# Patient Record
Sex: Female | Born: 1975 | Race: White | Hispanic: No | Marital: Married
Health system: Midwestern US, Community
[De-identification: ages and names within clinical notes are randomized; demographics above are authoritative.]

## PROBLEM LIST (undated history)

## (undated) DIAGNOSIS — Z1389 Encounter for screening for other disorder: Principal | ICD-10-CM

## (undated) DIAGNOSIS — K811 Chronic cholecystitis: Principal | ICD-10-CM

## (undated) DIAGNOSIS — Z9189 Other specified personal risk factors, not elsewhere classified: Principal | ICD-10-CM

## (undated) DIAGNOSIS — E611 Iron deficiency: Principal | ICD-10-CM

## (undated) DIAGNOSIS — K828 Other specified diseases of gallbladder: Principal | ICD-10-CM

## (undated) DIAGNOSIS — N939 Abnormal uterine and vaginal bleeding, unspecified: Secondary | ICD-10-CM

---

## 2023-01-15 ENCOUNTER — Encounter

## 2023-01-15 ENCOUNTER — Ambulatory Visit
Admit: 2023-01-15 | Discharge: 2023-01-15 | Payer: BLUE CROSS/BLUE SHIELD | Attending: Obstetrics & Gynecology | Primary: Family Medicine

## 2023-01-15 DIAGNOSIS — Z01419 Encounter for gynecological examination (general) (routine) without abnormal findings: Secondary | ICD-10-CM

## 2023-01-15 NOTE — Progress Notes (Signed)
Annual Exam Premenopausal    Sabrina Valenzuela is a 47 y.o. female.  Patient's last menstrual period was 12/20/2022 (exact date).   New Patient (Fertility issues/IVF twice )    The patient is a pleasant 47 year old  female with a last menstrual period of 12/20/22 who presents to the office for gyn exam and to discuss irregular menses.    Her menses are irregular but normal (from 13-40 days). She describes her period as lasting 3 days. She describes the flow as normal. She denies spotting between her periods. She has been having post coital vaginal bleeding (brown) for the last couple of months. She doesn't complain of cramps. She has severe pain with defecation on her menstrual cycle.     She is sexually active. The last time she had intercourse was 2 days ago. She reports dyspareunia, postcoital bleeding. She denies decreased libido. She is not concerned about STD's.     She is having additional significant GYN problems. Difficulty getting pregnant. She was followed in [place] by Dr. 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 He suggested she stop attempting pregnancy. She inquired about other methods. She is going to [place] to see her Gyn doctor there and will call to let me know if she wants a local referral.     Her last Pap smear was last year. The result was negative.    She does not have a primary care physician.       PHQ-9 Total Score: 0 (01/15/2023 11:32 AM)      GYN History  Menarche age: 43  Cycle Length35+ days Lasting 3 days.  Dysmenorrhea no  Patient's last menstrual period was 12/20/2022 (exact date).  Contraception:  no method  STD history: none  Abnormal Pap: Yes  Last Pap result: *NML  No results found for: "DIAG", "SPECADEQ", "METHOD", "[id number]", "HPV16"    Sexual history:   reports being sexually active and has had partner(s) who are female.     Medical conditions:    Since her last annual GYN exam about one year ago, she has not the  following changes in her health history: none.   Surgical history confirmed with patient.  has a past surgical history that includes myomectomy (10/14/2018); US BREAST BIOPSY W LOC DEVICE 1ST LESION RIGHT; and Breast biopsy (Right).    Past Medical History:   Diagnosis Date    History of high cholesterol      Past Surgical History:   Procedure Laterality Date    BREAST BIOPSY Right     MYOMECTOMY  10/14/2018    US BREAST BIOPSY W LOC DEVICE 1ST LESION RIGHT         No current outpatient medications on file.     No current facility-administered medications for this visit.       Allergies: Patient has no known allergies.     Tobacco History:  reports that she has never smoked. She has never used smokeless tobacco.   Alcohol Abuse:  reports no history of alcohol use.   Drug Abuse:  reports no history of drug use.     Family Medical/Cancer History: family history includes Breast Cancer in her mother and sister; Diabetes in her brother and mother; Hypertension in her brother and mother.       Review of Systems - History obtained from the patient  General/Constitutional:   Fatigue denies. Fever denies. Sleep disturbance denies.   Endocrine:  Cold intolerance denies. Heat intolerance denies.   Respiratory:   Shortness of breath denies.   Breast:   Breast pain denies. Nipple discharge denies.   Cardiovascular:   Chest Pain denies.   Gastrointestinal:   Abdominal pain denies. Constipation denies. Decreased appetite denies. Diarrhea denies.   Musculoskeletal:   Joint stiffness denies.   Skin:   Itching denies. Rash denies.   Genitourinary:   Difficulty urinating denies. Painful urination denies.   Neurologic:   Headache denies.   Psychiatric:   Anxiety denies. Depressed mood denies.          Physical Exam  BP 104/68   Ht 1.44 m (4' 8.69")   Wt 68.5 kg (151 lb)   LMP 12/20/2022 (Exact Date)   BMI 33.03 kg/m      General Examination:  GENERAL APPEARANCE: pleasant, well nourished, well developed, in no acute distress,  female.   HEAD: normocephalic, atraumatic.   NECK: no cervical lymphadenopathy, no thyromegaly.   HEART: normal rate and regular rhythm  LUNGS: clear to auscultation bilaterally.   BREASTS: normal, no masses palpable bilaterally, no discharge, no dimpling no axillary lymphadenopathy.   ABDOMEN:  soft, nontender, nondistended, no hepatosplenomegaly, no masses palpable.   SKIN: no suspicious lesions, warm and dry.   PSYCH: alert, oriented.   Gynecological:  EXTERNAL GENITALIA: introitus normal, no lesions.   URETHRA: normal.   VAGINA: normal, healthy pink mucosa without any lesions, normal discharge.   CERVIX: normal appearance, no cervical movement tenderness, no lesions or discharge or bleeding.   UTERUS: normal mobility, nontender, normal size.   ADNEXA: no mass, nontender.       Assessment and Plan:    1. Encounter for gynecological examination  2. Encounter to establish care  3. Cervical cancer screening  -     PAP IG, Aptima HPV and rfx 16/18,45 ([id number])  ([facility] DEFAULT); Future  4. Screening mammogram, encounter for  -     MAM TOMO DIGITAL SCREEN BILATERAL  5. Abnormal uterine bleeding (AUB)  -     Anti Mullerian Hormone; Future       gynecologic examination to establish care    Counseled re: diet, exercise, healthy lifestyle  Return for yearly wellness visits  Pt counseled regarding co-testing for high risk HPV with pap  Recommend screening mammo at 40     Return in about 1 year (around 01/15/2024) for Annual.  Donalee Citrin, MD

## 2023-01-15 NOTE — Other (Signed)
Normal pap

## 2023-01-18 LAB — VAGINITIS-BACTERIA/YEAST-CANDIDA
Candida Glabrata, NAA: NEGATIVE
Candida albicans, NAA: NEGATIVE

## 2023-01-19 LAB — PAP IG, APTIMA HPV AND RFX 16/18,45 (199305)
.: 0
HPV Aptima: NEGATIVE

## 2023-01-21 LAB — ANTI MULLERIAN HORMONE: Anti-Mullerian Hormone: <0.015 ng/mL

## 2023-02-05 ENCOUNTER — Inpatient Hospital Stay
Admit: 2023-02-05 | Payer: BLUE CROSS/BLUE SHIELD | Attending: Obstetrics & Gynecology | Primary: Obstetrics & Gynecology

## 2023-02-05 DIAGNOSIS — Z1231 Encounter for screening mammogram for malignant neoplasm of breast: Secondary | ICD-10-CM

## 2023-05-20 LAB — COLOGUARD TEST, EXTERNAL: Cologuard Test, External: NEGATIVE

## 2023-05-20 LAB — FECAL DNA COLORECTAL CANCER SCREENING (COLOGUARD): FIT-DNA (Cologuard): NEGATIVE

## 2023-07-17 ENCOUNTER — Encounter: Admit: 2023-07-17 | Admitting: Obstetrics & Gynecology

## 2023-07-17 DIAGNOSIS — Z124 Encounter for screening for malignant neoplasm of cervix: Secondary | ICD-10-CM

## 2023-07-30 ENCOUNTER — Encounter: Admit: 2023-07-30 | Admitting: Family Medicine

## 2023-07-30 ENCOUNTER — Ambulatory Visit
Admit: 2023-07-30 | Discharge: 2023-07-30 | Payer: BLUE CROSS/BLUE SHIELD | Attending: Family Medicine | Admitting: Family Medicine | Primary: Family Medicine

## 2023-07-30 VITALS — BP 118/74 | HR 74 | Resp 18 | Ht <= 58 in | Wt 146.0 lb

## 2023-07-30 DIAGNOSIS — I10 Essential (primary) hypertension: Secondary | ICD-10-CM

## 2023-07-30 DIAGNOSIS — Z1389 Encounter for screening for other disorder: Secondary | ICD-10-CM

## 2023-07-30 LAB — LIPID PANEL
Chol/HDL Ratio: 4.3 (ref 0.0–4.4)
Cholesterol, Total: 227 mg/dL — ABNORMAL HIGH (ref 100–200)
HDL: 53 mg/dL (ref >=50)
LDL Cholesterol: 145.8 mg/dL — ABNORMAL HIGH (ref 0.0–100.0)
LDL/HDL Ratio: 2.8
Triglycerides: 141 mg/dL (ref 0–149)
VLDL: 28.2 mg/dL (ref 5.0–40.0)

## 2023-07-30 LAB — COMPREHENSIVE METABOLIC PANEL
ALT: 15 U/L (ref 0–42)
AST: 19 U/L (ref 0–46)
Albumin/Globulin Ratio: 2.2 (ref 1.00–2.70)
Albumin: 4.8 g/dL (ref 3.5–5.2)
Alk Phosphatase: 60 U/L (ref 35–117)
Anion Gap: 15 mmol/L (ref 2–17)
BUN: 8 mg/dL (ref 6–20)
CO2: 22 mmol/L (ref 22–29)
Calcium: 9.3 mg/dL (ref 8.5–10.7)
Chloride: 104 mmol/L (ref 98–107)
Creatinine: 0.7 mg/dL (ref 0.5–1.0)
Est, Glom Filt Rate: 107 mL/min/1.73mÂ² (ref >=60)
Globulin: 2.2 g/dL (ref 1.9–4.4)
Glucose: 93 mg/dL (ref 70–99)
Osmolaliy Calculated: 279 mosm/kg (ref 270–287)
Potassium: 4.1 mmol/L (ref 3.5–5.3)
Sodium: 141 mmol/L (ref 135–145)
Total Bilirubin: 0.34 mg/dL (ref 0.00–1.20)
Total Protein: 7 g/dL (ref 5.7–8.3)

## 2023-07-30 LAB — CBC WITH AUTO DIFFERENTIAL
Basophils %: 0.6 % (ref 0.0–2.0)
Basophils Absolute: 0 10*3/uL (ref 0.0–0.2)
Eosinophils %: 1.7 % (ref 0.0–7.0)
Eosinophils Absolute: 0.1 10*3/uL (ref 0.0–0.5)
Hematocrit: 42 % (ref 34.0–47.0)
Hemoglobin: 13.9 g/dL (ref 11.5–15.7)
Immature Grans (Abs): 0.01 10*3/uL (ref 0.00–0.06)
Immature Granulocytes %: 0.2 % (ref 0.0–0.6)
Lymphocytes Absolute: 1.2 10*3/uL (ref 1.0–3.2)
Lymphocytes: 22.4 % (ref 15.0–45.0)
MCH: 29.3 pg (ref 27.0–34.5)
MCHC: 33.1 g/dL (ref 30.0–36.0)
MCV: 88.4 fL (ref 81.0–99.0)
MPV: 9.9 fL (ref 7.0–12.2)
Monocytes %: 8.7 % (ref 4.0–12.0)
Monocytes Absolute: 0.5 10*3/uL (ref 0.3–1.0)
NRBC Absolute: 0 10*3/uL (ref 0.000–0.012)
NRBC Automated: 0 % (ref 0.0–0.2)
Neutrophils %: 66.4 % (ref 42.0–74.0)
Neutrophils Absolute: 3.4 10*3/uL (ref 1.6–7.3)
Platelets: 260 10*3/uL (ref 140–440)
RBC: 4.75 x10e6/mcL (ref 3.60–5.20)
RDW: 11.9 % (ref 10.0–17.0)
WBC: 5.2 10*3/uL (ref 3.8–10.6)

## 2023-07-30 LAB — TSH WITH REFLEX: TSH: 2.59 u[IU]/mL (ref 0.358–3.740)

## 2023-07-30 LAB — IRON AND TIBC
Iron % Saturation: 30 % (ref 20–40)
Iron: 89 ug/dL (ref 37–145)
TIBC: 298 ug/dL (ref 250–450)
UIBC: 209 ug/dL (ref 112.0–347.0)

## 2023-07-30 LAB — VITAMIN D 25 HYDROXY: Vit D, 25-Hydroxy: 28.2 ng/mL — ABNORMAL LOW (ref 30.0–90.0)

## 2023-07-30 LAB — URINALYSIS W/ RFLX MICROSCOPIC
Bilirubin, Urine: NEGATIVE
Blood, Urine: NEGATIVE
Glucose, Ur: NEGATIVE
Ketones, Urine: NEGATIVE
Leukocyte Esterase, Urine: NEGATIVE
Nitrite, Urine: NEGATIVE
Protein, UA: NEGATIVE
Specific Gravity, UA: 1.02 (ref 1.003–1.035)
Urobilinogen, Urine: 0.2 EU/dL (ref >1.0)
pH, Urine: 6 (ref 4.5–8.0)

## 2023-07-30 LAB — FERRITIN: Ferritin: 40.4 ng/mL (ref 13.0–150.0)

## 2023-07-30 LAB — HEMOGLOBIN A1C
Estimated Avg Glucose: 123
Estimated Avg Glucose: 133
Hemoglobin A1C: 5.9 % (ref 4.0–6.0)

## 2023-07-30 NOTE — Progress Notes (Signed)
 CHIEF COMPLAINT:  Chief Complaint   Patient presents with    New Patient     Bp concerns   Last labs 4 months ago- will be scanned in chart   Pt feels that she is insulin resistant- wants a work up on that   Stong family history for camcer- wants a work up

## 2023-08-01 ENCOUNTER — Ambulatory Visit
Admit: 2023-08-01 | Discharge: 2023-08-01 | Payer: BLUE CROSS/BLUE SHIELD | Attending: Physician Assistant | Admitting: Physician Assistant | Primary: Family Medicine

## 2023-08-01 VITALS — BP 111/71 | HR 88 | Ht <= 58 in | Wt 146.2 lb

## 2023-08-01 DIAGNOSIS — Z9189 Other specified personal risk factors, not elsewhere classified: Secondary | ICD-10-CM

## 2023-08-01 NOTE — Progress Notes (Signed)
 BREAST SURGERY NEW PATIENT VISIT    08/01/2023    CHIEF COMPLAINT:  High risk for future breast cancer.    HISTORY OF PRESENT ILLNESS:  Sabrina Valenzuela is a 47 y.o. woman referred by Dr. Diana Eves who requested that I evaluate her for her elevated

## 2023-08-03 ENCOUNTER — Telehealth: Admit: 2023-08-03 | Admitting: Family Medicine

## 2023-08-03 DIAGNOSIS — G4733 Obstructive sleep apnea (adult) (pediatric): Secondary | ICD-10-CM

## 2023-08-03 NOTE — Telephone Encounter (Signed)
 Patient is calling in regards to a message that was sent to her by Dr. Baltazar Apo.  She is wanting to let Dr. Baltazar Apo know that she has been taking Vitamin D on and off for the last month. She has started back taking it the last two days, 2 tablets.    Also

## 2023-08-10 NOTE — Telephone Encounter (Signed)
 Spoke with patient and relayed the message. She said she spoke with scheduling and the hospital only had old orders. I did inform her that the order was placed today. She said she will reach out to them today or Monday. I advised her to give [place] a call back

## 2023-08-13 NOTE — Telephone Encounter (Signed)
 Clinvar review, VUS, 3 submitters, no discrepancies  -------------------------------------  Patient is aware of results.

## 2023-08-27 NOTE — Telephone Encounter (Signed)
Pt reports [employer] denied her claim for this test. They advised her it was coded incorrectly. The pt has a bill $261.00 and $251.00 (two separate bills) The pt can be reached at [phone].

## 2023-08-29 ENCOUNTER — Inpatient Hospital Stay: Admit: 2023-08-29 | Payer: BLUE CROSS/BLUE SHIELD | Attending: Family Medicine | Primary: Family Medicine

## 2023-08-29 DIAGNOSIS — G4733 Obstructive sleep apnea (adult) (pediatric): Secondary | ICD-10-CM

## 2023-09-04 ENCOUNTER — Inpatient Hospital Stay: Admit: 2023-09-04 | Payer: BLUE CROSS/BLUE SHIELD | Primary: Family Medicine

## 2023-09-04 DIAGNOSIS — Z9189 Other specified personal risk factors, not elsewhere classified: Secondary | ICD-10-CM

## 2023-09-04 MED ORDER — GADOTERIDOL 279.3 MG/ML IV SOLN
279.3 | Freq: Once | INTRAVENOUS | Status: AC | PRN
Start: 2023-09-04 — End: 2023-09-04
  Administered 2023-09-04: 17:00:00 15 mL via INTRAVENOUS

## 2023-09-05 NOTE — Telephone Encounter (Signed)
Patient came to the office, was given the billing number for [facility].

## 2023-09-07 NOTE — Telephone Encounter (Signed)
Pt called to thank Fleet Contras for assisting her and is requesting call back

## 2023-09-10 NOTE — Telephone Encounter (Addendum)
I spoke with patient. She spoke with [facility] billing. She spoke with [employer]. Patient states that Berenda Morale has declined the request. Patient is requesting her office note be mailed to her to submit to insurance to submit an appeal. I have given this to Lawernce Ion to send. Patient will reach out if she needs additional assistance.

## 2023-09-12 ENCOUNTER — Ambulatory Visit: Payer: BLUE CROSS/BLUE SHIELD | Primary: Family Medicine

## 2023-09-13 NOTE — Telephone Encounter (Signed)
Patient request for prescription for:     candesartan (ATACAND) 8 MG tablet  Take 1 tablet oral daily, 60 days    LOV:  07/30/2023  NOV:  10/23/2023    No changes to medicine since last visit.    [facility] - [place], [place] - [address]      Please assist

## 2023-09-15 MED ORDER — CANDESARTAN CILEXETIL 8 MG PO TABS
8 | ORAL_TABLET | Freq: Every day | ORAL | 2 refills | Status: AC
Start: 2023-09-15 — End: ?

## 2023-10-23 ENCOUNTER — Ambulatory Visit
Admit: 2023-10-23 | Discharge: 2023-10-23 | Payer: BLUE CROSS/BLUE SHIELD | Attending: Family Medicine | Primary: Family Medicine

## 2023-10-23 VITALS — BP 116/72 | HR 67 | Temp 98.20000°F | Resp 18 | Ht <= 58 in | Wt 141.3 lb

## 2023-10-23 DIAGNOSIS — I10 Essential (primary) hypertension: Secondary | ICD-10-CM

## 2023-10-23 MED ORDER — CANDESARTAN CILEXETIL 8 MG PO TABS
8 | ORAL_TABLET | Freq: Every day | ORAL | 3 refills | Status: DC
Start: 2023-10-23 — End: 2024-08-26

## 2023-10-23 NOTE — Progress Notes (Signed)
 CHIEF COMPLAINT:  Chief Complaint   Patient presents with    Follow-up     Burning issue upper back - along spine  2 months         HISTORY OF PRESENT ILLNESS:  Sabrina Valenzuela is a 48 y.o. female  who presents in follow-up.   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   HTN- controlled on current BP meds.   Vitamin D Deficiency- noted she is on Vitamin D2000units daily.   HPL- noted monitoring now, not on statin.   Prediabetes- 5.9, may have insulin resistance. She is doing intermittent fasting and working out 3 days per week,  lost 8-10 lbs.   She notes burning in her upper back and very sensitive and started about 2 months ago. She feels it is less so bothersome than it was. However it is distinctly different- she notes that her husband said it was a small nodule there to start- now it is a larger red area under her bra strap. It is not itchy either. She had put on some steroid cream prior.     Pap smear- last pap smear- June 2024, normal and neg HPV.       PHQ:      10/23/2023    12:03 PM   PHQ-9    Little interest or pleasure in doing things 0   Feeling down, depressed, or hopeless 0   PHQ-2 Score 0   PHQ-9 Total Score 0       CURRENT MEDICATION LIST:    Current Outpatient Medications   Medication Sig Dispense Refill    candesartan (ATACAND) 8 MG tablet Take 1 tablet by mouth daily 90 tablet 3    fexofenadine (ALLEGRA) 60 MG tablet Take 1 tablet by mouth in the morning and 1 tablet in the evening.      omeprazole (PRILOSEC) 40 MG delayed release capsule Take 1 capsule by mouth every morning (before breakfast)      Cholecalciferol 50 MCG (2000 UT) TABS Take 1 tablet by mouth daily       No current facility-administered medications for this visit.        ALLERGIES:    Allergies   Allergen Reactions    Kiwi Extract Other (See Comments)     "Hayfever"        HISTORY:  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     History of high cholesterol     Hyperlipidemia August , 2024    Hypertension August ,2024      Past Surgical History:   Procedure Laterality Date    BREAST BIOPSY Right     MYOMECTOMY  10/14/2018    US BREAST BIOPSY W LOC DEVICE 1ST LESION RIGHT        Social History     Socioeconomic History    Marital status: Married     Spouse name: Not on file    Number of children: Not on file    Years of education: Not on file    Highest education level: Not on file   Occupational History    Not on file   Tobacco Use    Smoking status: Never    Smokeless tobacco: Never   Vaping Use    Vaping status: Never Used   Substance and Sexual Activity    Alcohol use: Never    Drug use: Never  Sexual activity: Yes     Partners: Male     Comment: husband   Other Topics Concern    Not on file   Social History Narrative    Not on file     Social Drivers of Health     Financial Resource Strain: Low Risk  (04/27/2023)    Received from [facility]    Overall [employer] Strain (CARDIA)     Difficulty of Paying Living Expenses: Not hard at all   Food Insecurity: Not on file   Transportation Needs: No Transportation Needs (04/27/2023)    Received from [facility]    PRAPARE - Therapist, art (Medical): No     Lack of Transportation (Non-Medical): No   Physical Activity: Not on file   Stress: Not on file   Social Connections: Not on file   Intimate Partner Violence: Not on file   Housing Stability: Not on file      Family History   Problem Relation Age of Onset    Diabetes Mother     Hypertension Mother     Breast Cancer Mother 65    Other Cancer Mother 51        Bladder cancer    High Blood Pressure Mother     High Cholesterol Mother     Diabetes Father     High Blood Pressure Father     High Cholesterol Father     Left Breast Cancer Sister 70    Diabetes Brother     Hypertension Brother     High Cholesterol Brother     Pancreatic Cancer Maternal Aunt      Endometrial Cancer Paternal Aunt 17    Other Cancer Paternal Aunt         Possible bone or GI cancer    Brain Cancer Paternal Uncle         Review of Systems   Constitutional: Negative.    HENT: Negative.     Eyes: Negative.    Respiratory: Negative.     Cardiovascular: Negative.    Gastrointestinal: Negative.    Endocrine: Negative.    Genitourinary: Negative.    Musculoskeletal: Negative.    Skin: Negative.    Allergic/Immunologic: Negative.    Neurological: Negative.    Hematological: Negative.    Psychiatric/Behavioral: Negative.          PHYSICAL EXAM:  Vital Signs -   Visit Vitals  BP 116/72   Pulse 67   Temp 98.2 F (36.8 C) (Oral)   Resp 18   Ht 1.422 m (4\' 8" )   Wt 64.1 kg (141 lb 4.8 oz)   SpO2 98%   BMI 31.68 kg/m        Physical Exam  Vitals and nursing note reviewed.   Constitutional:       General: She is not in acute distress.     Appearance: Normal appearance. She is normal weight.   HENT:      Head: Normocephalic and atraumatic.      Mouth/Throat:      Mouth: Mucous membranes are moist.      Pharynx: Oropharynx is clear. No oropharyngeal exudate or posterior oropharyngeal erythema.   Eyes:      Extraocular Movements: Extraocular movements intact.      Conjunctiva/sclera: Conjunctivae normal.      Pupils: Pupils are equal, round, and reactive to light.   Neck:  Vascular: No carotid bruit.   Cardiovascular:      Rate and Rhythm: Normal rate and regular rhythm.      Heart sounds: Normal heart sounds. No murmur heard.  Pulmonary:      Effort: Pulmonary effort is normal. No respiratory distress.      Breath sounds: Normal breath sounds.   Abdominal:      General: Bowel sounds are normal. There is no distension.      Palpations: Abdomen is soft.      Tenderness: There is no abdominal tenderness.   Musculoskeletal:         General: No swelling or tenderness. Normal range of motion.      Cervical back: Normal range of motion and neck supple.      Right lower leg: No edema.      Left lower leg: No  edema.   Lymphadenopathy:      Cervical: No cervical adenopathy.   Skin:     General: Skin is warm and dry.      Findings: No erythema or rash.      Comments: Noted under right side of back bra strap- noted 2inch diameter erythematous with maybe small hyperpigmented spots.    Neurological:      General: No focal deficit present.      Mental Status: She is alert and oriented to person, place, and time. Mental status is at baseline.   Psychiatric:         Mood and Affect: Mood normal.         Behavior: Behavior normal.         Thought Content: Thought content normal.         Judgment: Judgment normal.          LABS  No results found for this visit on 10/23/23.  Orders Only on 07/30/2023   Component Date Value Ref Range Status    Color, UA 07/30/2023 Yellow   Final    Appearance 07/30/2023 Clear   Final    Glucose, Ur 07/30/2023 Negative  Negative Final    Bilirubin, Urine 07/30/2023 Negative  Negative Final    Ketones, Urine 07/30/2023 Negative  Negative Final    Specific Gravity, UA 07/30/2023 1.020  1.003 - 1.035 Final    Blood, Urine 07/30/2023 Negative  Negative Final    pH, Urine 07/30/2023 6.0  4.5 - 8.0 Final    Protein, UA 07/30/2023 Negative  Negative Final    Urobilinogen, Urine 07/30/2023 0.2  >1.0 EU/dL Final    Nitrite, Urine 07/30/2023 Negative  Negative Final    Leukocyte Esterase, Urine 07/30/2023 Negative  Negative Final    TSH 07/30/2023 2.590  0.358 - 3.740 mcIU/mL Final    Comment: TSH INTERPRETATION:    Controversy exists over an acceptable TSH range. Some experts argue that a  narrower reference range is better and will increase the detection of thyroid  disease, particularly marginal hypothyroidism.      Iron 07/30/2023 89  37 - 145 mcg/dL Final    UIBC 16/05/9603 209.0  112.0 - 347.0 mcg/dL Final    TIBC 54/04/8118 298  250 - 450 mcg/dL Final    Iron % Saturation 07/30/2023 30  20 - 40 % Final    Ferritin 07/30/2023 40.4  13.0 - 150.0 ng/mL Final    Cholesterol, Total 07/30/2023 227 (H)  100 -  200 mg/dL Final    Comment: The National Cholesterol Education Program has published reference  cholesterol values for cardiovascular  risk to be:    Less than 200 mg/dL     = Low Risk    478 to 239 mg/dL        = Borderline Risk    240mg /dL and greater    = High Risk      HDL 07/30/2023 53  >=50 mg/dL Final    Comment: The National Lipid Association and the [employer] Cholesterol Education Program  (NCEP) have set the guidelines for high-density lipoprotein (HDL) cholesterol  in adults ages 37 and up.      Triglycerides 07/30/2023 141  0 - 149 mg/dL Final    Comment:   TRIGLYCERIDE INTERPRETATION:                          Recommended Fasting Triglyc Levels for Adults                        =============================================                        Desirable                        < 150 mg/dL                        Average                          < 200 mg/dL                        Borderline High             200 to 500 mg/dL                        Hypertriglyceridemic             > 500 mg/dL                        =============================================      LDL Cholesterol 07/30/2023 145.8 (H)  0.0 - 100.0 mg/dL Final    LDL/HDL Ratio 07/30/2023 2.8   Final    Chol/HDL Ratio 07/30/2023 4.3  0.0 - 4.4 Final    VLDL 07/30/2023 28.2  5.0 - 40.0 mg/dL Final    Sodium 29/56/2130 141  135 - 145 mmol/L Final    Potassium 07/30/2023 4.1  3.5 - 5.3 mmol/L Final    Chloride 07/30/2023 104  98 - 107 mmol/L Final    CO2 07/30/2023 22  22 - 29 mmol/L Final    Glucose 07/30/2023 93  70 - 99 mg/dL Final    BUN 86/57/8469 8  6 - 20 mg/dL Final    Creatinine 62/95/2841 0.7  0.5 - 1.0 mg/dL Final    Anion Gap 32/44/0102 15  2 - 17 mmol/L Final    Osmolaliy Calculated 07/30/2023 279  270 - 287 mOsm/kg Final    Calcium 07/30/2023 9.3  8.5 - 10.7 mg/dL Final    Total Protein 07/30/2023 7.0  5.7 - 8.3 g/dL Final    Albumin 72/53/6644 4.8  3.5 - 5.2 g/dL Final    Globulin 03/47/4259 2.2  1.9 - 4.4 g/dL Final     Albumin/Globulin Ratio 07/30/2023 2.20  1.00 - 2.70 Final    Total Bilirubin  07/30/2023 0.34  0.00 - 1.20 mg/dL Final    Alk Phosphatase 07/30/2023 60  35 - 117 unit/L Final    AST 07/30/2023 19  0 - 46 unit/L Final    ALT 07/30/2023 15  0 - 42 unit/L Final    Est, Glom Filt Rate 07/30/2023 107  >=60 mL/min/1.56m Final    Comment: VERIFIED by Discern Expert.  GFR Interpretation:                                                                         % OF  KIDNEY  GFR                                                        STAGE  FUNCTION  ==================================================================================    > 90        Normal kidney function                       STAGE 1  90-100%  89 to 60      Mild loss of kidney function                 STAGE 2  80-60%  59 to 45      Mild to moderate loss of kidney function     STAGE 3a  59-45%  44 to 30      Moderate to severe loss of kidney function   STAGE 3b  44-30%  29 to 15      Severe loss of kidney function               STAGE 4  29-15%    < 15        Kidney failure                               STAGE 5  <15%  ==================================================================================  Modified from National Kidney Foundation    GFR Calculation performed using the CKD-EPI 2021 equation developed for use  with IDMS traceable creatinine methods and                            is the calculation recommended by  the [facility] for estimating GFR in adults.      Vit D, 25-Hydroxy 07/30/2023 28.2 (L)  30.0 - 90.0 ng/mL Final    Hemoglobin A1C 07/30/2023 5.9  4.0 - 6.0 % Final    Comment: HEMOGLOBIN A1C INTERPRETATION:    The following arbitrary ranges may be used for interpretation of the results.  However, factors such as duration of diabetes, adherence to therapy, and  patient age should also be considered in assessing degree of blood glucose  control.    Hemoglobin A1C                 Avg. Blood  Sugar  --------------------------------------------------------------  6%  135 mg/dL  7%                           170 mg/dL  8%                           205 mg/dL  9%                           240 mg/dL  25%                          275 mg/dL    ======================================================    A1C                      Glucose Control  ----------------------------------------------------------------  < 6.0 %                   Normal  6.0 - 6.9 %               Abnormal  7.0 - 7.9 %               Sub-Optimal Control  > 8.0 %                   Inadequate Control      Estimated Avg Glucose 07/30/2023 123   Final    Estimated Avg Glucose 07/30/2023 133   Final    WBC 07/30/2023 5.2  3.8 - 10.6 x10e3/mcL Final    RBC 07/30/2023 4.75  3.60 - 5.20 x10e6/mcL Final    Hemoglobin 07/30/2023 13.9  11.5 - 15.7 g/dL Final    Hematocrit 36/64/4034 42.0  34.0 - 47.0 % Final    MCV 07/30/2023 88.4  81.0 - 99.0 fL Final    MCH 07/30/2023 29.3  27.0 - 34.5 pg Final    MCHC 07/30/2023 33.1  30.0 - 36.0 g/dL Final    RDW 74/25/9563 11.9  10.0 - 17.0 % Final    Platelets 07/30/2023 260  140 - 440 x10e3/mcL Final    MPV 07/30/2023 9.9  7.0 - 12.2 fL Final    NRBC Automated 07/30/2023 0.0  0.0 - 0.2 % Final    NRBC Absolute 07/30/2023 0.000  0.000 - 0.012 x10e3/mcL Final    Neutrophils % 07/30/2023 66.4  42.0 - 74.0 % Final    Lymphocytes 07/30/2023 22.4  15.0 - 45.0 % Final    Monocytes % 07/30/2023 8.7  4.0 - 12.0 % Final    Eosinophils % 07/30/2023 1.7  0.0 - 7.0 % Final    Basophils % 07/30/2023 0.6  0.0 - 2.0 % Final    Neutrophils Absolute 07/30/2023 3.4  1.6 - 7.3 x10e3/mcL Final    Lymphocytes Absolute 07/30/2023 1.2  1.0 - 3.2 x10e3/mcL Final    Monocytes Absolute 07/30/2023 0.5  0.3 - 1.0 x10e3/mcL Final    Eosinophils Absolute 07/30/2023 0.1  0.0 - 0.5 x10e3/mcL Final    Basophils Absolute 07/30/2023 0.0  0.0 - 0.2 x10e3/mcL Final    Immature Granulocytes % 07/30/2023 0.2  0.0 - 0.6 % Final     Immature Grans (Abs) 07/30/2023 0.01  0.00 - 0.06 x10e3/mcL Final       IMPRESSION/PLAN    1. Primary hypertension  Controlled on current medication dosing, will continue  - candesartan (ATACAND) 8 MG tablet; Take 1  tablet by mouth daily  Dispense: 90 tablet; Refill: 3    2. Mixed hyperlipidemia  Cont dietary monitoring and exercise    3. Prediabetes  Prediabetes- consider STELO as discussed today, consider metformin XR 500mg  daily    4. Vitamin D deficiency  Cont on current vitamin D    5. Rash  Question if this is shingles or even reverse shingles but has been 2 months, no vesicles but localized. Referral to Dermatology as discussed.   Grafton Folk, MD - Dermatology     Follow up and Dispositions:  Return in about 6 months (around 04/24/2024).       Derek Mound, MD

## 2023-11-20 NOTE — Telephone Encounter (Signed)
 Called patient to cancel/reschedule annual apt due to provider being ooo. No answer left voicemail, if patient calls back please schedule annual for next available annual slot thank you!

## 2024-01-21 ENCOUNTER — Encounter: Attending: Obstetrics & Gynecology | Primary: Family Medicine

## 2024-02-07 NOTE — Telephone Encounter (Signed)
 Patient went to [facility] Thursday last week for severe gastric pain (gallbladder)       Patient would like to request for Dr Zacarias to order an US   - to determine if surgery is needed     Please assist

## 2024-02-11 NOTE — Telephone Encounter (Signed)
 Patient calling to follow up on request made last Thursday for orders for US  abdomen,     If she needs    Gall bladder surgery for gall bladder stones    Please advise

## 2024-02-12 ENCOUNTER — Ambulatory Visit: Admit: 2024-02-12 | Discharge: 2024-02-12 | Payer: BLUE CROSS/BLUE SHIELD | Attending: Family | Primary: Family Medicine

## 2024-02-12 VITALS — BP 122/82 | HR 89 | Temp 98.30000°F | Ht <= 58 in | Wt 145.0 lb

## 2024-02-12 DIAGNOSIS — R1011 Right upper quadrant pain: Principal | ICD-10-CM

## 2024-02-12 MED ORDER — OMEPRAZOLE MAGNESIUM 20 MG PO TBEC
20 | ORAL_TABLET | Freq: Every day | ORAL | 1 refills | 90.00000 days | Status: DC
Start: 2024-02-12 — End: 2024-08-26

## 2024-02-12 NOTE — Progress Notes (Signed)
 CHIEF COMPLAINT:  Chief Complaint   Patient presents with    Other     ABD US  gall stones, done 6-7 years ago, 2 weeks ago went to ER for gallbladder attack, needs to see GI doctor    Completed sleep study and would like to discuss, told she can use CPAP    Metformin slow release for insulin resistance once in the week, fast blood sugar 141 in ER        HISTORY OF PRESENT ILLNESS:  Sabrina Valenzuela is a 48 y.o. female  who presents today for follow-up.     She went to the ER 2 weeks ago due to extreme epigastric pain-I unfortunately do not have access to these records today, she took Omeprazole to see if this would help. Notes the pain worsened so she went to the ER, had imaging that confirmed gallstones. She notes she has been having issues with her gallbladder for years and just needs to get it removed. She would like to eventually be referred to a GI surgeon at [facility] who she knows but he wants her to get an updated US  first.   OSA: did sleep study earlier in the year but was never referred to pulm to discuss next steps   Insulin resistance: fasting blood sugars have been 128-140, she has a strong family hx of diabetes but for now would like to hold off on Metformin until she takes care of her gallbladder.     PHQ:      02/12/2024     1:45 PM   PHQ-9    Little interest or pleasure in doing things 0   Feeling down, depressed, or hopeless 0   PHQ-2 Score 0   PHQ-9 Total Score 0       CURRENT MEDICATION LIST:    Current Outpatient Medications   Medication Sig Dispense Refill    omeprazole (PRILOSEC) 40 MG delayed release capsule Take 40 mg by mouth as needed ONLY TABLET FORM      pantoprazole (PROTONIX) 40 MG tablet Take 1 tablet by mouth daily      sucralfate (CARAFATE) 1 GM tablet Take 1 tablet by mouth 2 times daily      candesartan  (ATACAND ) 8 MG tablet Take 1 tablet by mouth daily 90 tablet 3    fexofenadine (ALLEGRA) 60 MG tablet Take 1 tablet by mouth as needed      Cholecalciferol 50 MCG (2000 UT) TABS Take 1 tablet  by mouth daily       No current facility-administered medications for this visit.        ALLERGIES:    Allergies   Allergen Reactions    Kiwi Extract Other (See Comments)     Hayfever    Other      All fruits cause hayfever        HISTORY:  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    History of high cholesterol     Hyperlipidemia August , 2024    Hypertension August ,2024      Past Surgical History:   Procedure Laterality Date    BREAST BIOPSY Right     MYOMECTOMY  10/14/2018    US  BREAST BIOPSY W LOC DEVICE 1ST LESION RIGHT        Social History     Socioeconomic History    Marital status: Married     Spouse name: Not on file    Number of children:  Not on file    Years of education: Not on file    Highest education level: Not on file   Occupational History    Not on file   Tobacco Use    Smoking status: Never    Smokeless tobacco: Never   Vaping Use    Vaping status: Never Used   Substance and Sexual Activity    Alcohol use: Never    Drug use: Never    Sexual activity: Yes     Partners: Male     Comment: husband   Other Topics Concern    Not on file   Social History Narrative    Not on file     Social Drivers of Health     Financial Resource Strain: Low Risk  (04/27/2023)    Received from [facility]     Overall Financial Resource Strain (CARDIA)     Difficulty of Paying Living Expenses: Not hard at all   Food Insecurity: Not on file   Transportation Needs: No Transportation Needs (04/27/2023)    Received from [facility]     [employer] - Transportation     Lack of Transportation (Medical): No     Lack of Transportation (Non-Medical): No   Physical Activity: Not on file   Stress: Not on file   Social Connections: Not on file   Intimate Partner Violence: Not on file   Housing Stability: Not on file      Family History   Problem Relation Age of Onset    Diabetes Mother     Hypertension Mother     Breast Cancer Mother 35    Other Cancer  Mother 69        Bladder cancer    High Blood Pressure Mother     High Cholesterol Mother     Diabetes Father     High Blood Pressure Father     High Cholesterol Father     Left Breast Cancer Sister 17    Diabetes Brother     Hypertension Brother     High Cholesterol Brother     Pancreatic Cancer Maternal Aunt     Endometrial Cancer Paternal Aunt 32    Other Cancer Paternal Aunt         Possible bone or GI cancer    Brain Cancer Paternal Uncle         REVIEW OF SYSTEMS:  Review of systems is as indicated in HPI otherwise negative.    PHYSICAL EXAM:  Vital Signs -   Visit Vitals  BP 122/82   Pulse 89   Temp 98.3 F (36.8 C) (Oral)   Ht 1.422 m (4' 8)   Wt 65.8 kg (145 lb)   SpO2 98%   BMI 32.51 kg/m          Physical Exam  Constitutional:       General: She is awake.      Appearance: Normal appearance. She is obese.   HENT:      Head: Normocephalic and atraumatic.      Right Ear: Tympanic membrane, ear canal and external ear normal.      Left Ear: Tympanic membrane, ear canal and external ear normal.      Nose: Nose normal.      Mouth/Throat:      Mouth: Mucous membranes are moist.      Pharynx: Oropharynx is clear. Uvula midline.   Cardiovascular:      Rate and  Rhythm: Normal rate and regular rhythm.   Pulmonary:      Effort: Pulmonary effort is normal.      Breath sounds: Normal breath sounds.   Abdominal:      General: Abdomen is flat. Bowel sounds are normal.      Palpations: Abdomen is soft.      Tenderness: There is abdominal tenderness in the right upper quadrant.   Musculoskeletal:         General: Normal range of motion.   Skin:     General: Skin is warm and dry.   Neurological:      General: No focal deficit present.      Mental Status: She is alert and oriented to person, place, and time. Mental status is at baseline.   Psychiatric:         Mood and Affect: Mood normal.            LABS  No results found for this visit on 02/12/24.  Orders Only on 07/30/2023   Component Date Value Ref Range Status     Color, UA 07/30/2023 Yellow   Final    Appearance 07/30/2023 Clear   Final    Glucose, Ur 07/30/2023 Negative  Negative Final    Bilirubin, Urine 07/30/2023 Negative  Negative Final    Ketones, Urine 07/30/2023 Negative  Negative Final    Specific Gravity, UA 07/30/2023 1.020  1.003 - 1.035 Final    Blood, Urine 07/30/2023 Negative  Negative Final    pH, Urine 07/30/2023 6.0  4.5 - 8.0 Final    Protein, UA 07/30/2023 Negative  Negative Final    Urobilinogen, Urine 07/30/2023 0.2  >1.0 EU/dL Final    Nitrite, Urine 07/30/2023 Negative  Negative Final    Leukocyte Esterase, Urine 07/30/2023 Negative  Negative Final    TSH 07/30/2023 2.590  0.358 - 3.740 mcIU/mL Final    Comment: TSH INTERPRETATION:    Controversy exists over an acceptable TSH range. Some experts argue that a  narrower reference range is better and will increase the detection of thyroid  disease, particularly marginal hypothyroidism.      Iron 07/30/2023 89  37 - 145 mcg/dL Final    UIBC 87/83/7975 209.0  112.0 - 347.0 mcg/dL Final    TIBC 87/83/7975 298  250 - 450 mcg/dL Final    Iron % Saturation 07/30/2023 30  20 - 40 % Final    Ferritin 07/30/2023 40.4  13.0 - 150.0 ng/mL Final    Cholesterol, Total 07/30/2023 227 (H)  100 - 200 mg/dL Final    Comment: The National Cholesterol Education Program has published reference  cholesterol values for cardiovascular risk to be:    Less than 200 mg/dL     = Low Risk    799 to 239 mg/dL        = Borderline Risk    240mg /dL and greater    = High Risk      HDL 07/30/2023 53  >=50 mg/dL Final    Comment: The National Lipid Association and the [employer] Cholesterol Education Program  (NCEP) have set the guidelines for high-density lipoprotein (HDL) cholesterol  in adults ages 6 and up.      Triglycerides 07/30/2023 141  0 - 149 mg/dL Final    Comment:   TRIGLYCERIDE INTERPRETATION:                          Recommended Fasting Triglyc Levels for Adults                         =============================================  Desirable                        < 150 mg/dL                        Average                          < 200 mg/dL                        Borderline High             200 to 500 mg/dL                        Hypertriglyceridemic             > 500 mg/dL                        =============================================      LDL Cholesterol 07/30/2023 145.8 (H)  0.0 - 100.0 mg/dL Final    LDL/HDL Ratio 07/30/2023 2.8   Final    Chol/HDL Ratio 07/30/2023 4.3  0.0 - 4.4 Final    VLDL 07/30/2023 28.2  5.0 - 40.0 mg/dL Final    Sodium 87/83/7975 141  135 - 145 mmol/L Final    Potassium 07/30/2023 4.1  3.5 - 5.3 mmol/L Final    Chloride 07/30/2023 104  98 - 107 mmol/L Final    CO2 07/30/2023 22  22 - 29 mmol/L Final    Glucose 07/30/2023 93  70 - 99 mg/dL Final    BUN 87/83/7975 8  6 - 20 mg/dL Final    Creatinine 87/83/7975 0.7  0.5 - 1.0 mg/dL Final    Anion Gap 87/83/7975 15  2 - 17 mmol/L Final    Osmolaliy Calculated 07/30/2023 279  270 - 287 mOsm/kg Final    Calcium 07/30/2023 9.3  8.5 - 10.7 mg/dL Final    Total Protein 07/30/2023 7.0  5.7 - 8.3 g/dL Final    Albumin 87/83/7975 4.8  3.5 - 5.2 g/dL Final    Globulin 87/83/7975 2.2  1.9 - 4.4 g/dL Final    Albumin/Globulin Ratio 07/30/2023 2.20  1.00 - 2.70 Final    Total Bilirubin 07/30/2023 0.34  0.00 - 1.20 mg/dL Final    Alk Phosphatase 07/30/2023 60  35 - 117 unit/L Final    AST 07/30/2023 19  0 - 46 unit/L Final    ALT 07/30/2023 15  0 - 42 unit/L Final    Est, Glom Filt Rate 07/30/2023 107  >=60 mL/min/1.39m Final    Comment: VERIFIED by Discern Expert.  GFR Interpretation:                                                                         % OF  KIDNEY  GFR  STAGE  FUNCTION  ==================================================================================    > 90        Normal kidney function                       STAGE 1  90-100%  89  to 60      Mild loss of kidney function                 STAGE 2  80-60%  59 to 45      Mild to moderate loss of kidney function     STAGE 3a  59-45%  44 to 30      Moderate to severe loss of kidney function   STAGE 3b  44-30%  29 to 15      Severe loss of kidney function               STAGE 4  29-15%    < 15        Kidney failure                               STAGE 5  <15%  ==================================================================================  Modified from National Kidney Foundation    GFR Calculation performed using the CKD-EPI 2021 equation developed for use  with IDMS traceable creatinine methods and                            is the calculation recommended by  the [facility] for estimating GFR in adults.      Vit D, 25-Hydroxy 07/30/2023 28.2 (L)  30.0 - 90.0 ng/mL Final    Hemoglobin A1C 07/30/2023 5.9  4.0 - 6.0 % Final    Comment: HEMOGLOBIN A1C INTERPRETATION:    The following arbitrary ranges may be used for interpretation of the results.  However, factors such as duration of diabetes, adherence to therapy, and  patient age should also be considered in assessing degree of blood glucose  control.    Hemoglobin A1C                 Avg. Blood Sugar  --------------------------------------------------------------  6%                           135 mg/dL  7%                           170 mg/dL  8%                           205 mg/dL  9%                           240 mg/dL  89%                          275 mg/dL    ======================================================    A1C                      Glucose Control  ----------------------------------------------------------------  < 6.0 %                   Normal  6.0 - 6.9 %  Abnormal  7.0 - 7.9 %               Sub-Optimal Control  > 8.0 %                   Inadequate Control      Estimated Avg Glucose 07/30/2023 123   Final    Estimated Avg Glucose 07/30/2023 133   Final    WBC 07/30/2023 5.2  3.8 - 10.6 x10e3/mcL Final     RBC 07/30/2023 4.75  3.60 - 5.20 x10e6/mcL Final    Hemoglobin 07/30/2023 13.9  11.5 - 15.7 g/dL Final    Hematocrit 87/83/7975 42.0  34.0 - 47.0 % Final    MCV 07/30/2023 88.4  81.0 - 99.0 fL Final    MCH 07/30/2023 29.3  27.0 - 34.5 pg Final    MCHC 07/30/2023 33.1  30.0 - 36.0 g/dL Final    RDW 87/83/7975 11.9  10.0 - 17.0 % Final    Platelets 07/30/2023 260  140 - 440 x10e3/mcL Final    MPV 07/30/2023 9.9  7.0 - 12.2 fL Final    NRBC Automated 07/30/2023 0.0  0.0 - 0.2 % Final    NRBC Absolute 07/30/2023 0.000  0.000 - 0.012 x10e3/mcL Final    Neutrophils % 07/30/2023 66.4  42.0 - 74.0 % Final    Lymphocytes 07/30/2023 22.4  15.0 - 45.0 % Final    Monocytes % 07/30/2023 8.7  4.0 - 12.0 % Final    Eosinophils % 07/30/2023 1.7  0.0 - 7.0 % Final    Basophils % 07/30/2023 0.6  0.0 - 2.0 % Final    Neutrophils Absolute 07/30/2023 3.4  1.6 - 7.3 x10e3/mcL Final    Lymphocytes Absolute 07/30/2023 1.2  1.0 - 3.2 x10e3/mcL Final    Monocytes Absolute 07/30/2023 0.5  0.3 - 1.0 x10e3/mcL Final    Eosinophils Absolute 07/30/2023 0.1  0.0 - 0.5 x10e3/mcL Final    Basophils Absolute 07/30/2023 0.0  0.0 - 0.2 x10e3/mcL Final    Immature Granulocytes % 07/30/2023 0.2  0.0 - 0.6 % Final    Immature Grans (Abs) 07/30/2023 0.01  0.00 - 0.06 x10e3/mcL Final       IMPRESSION/PLAN    1. Right upper quadrant abdominal pain  -Mild RUQ tenderness noted on exam today. Symptoms have improved since ER visit, will go ahead and order US  so that referral can be placed to surgeon for further evaluation as likely needs this removed since symptoms have been chronic over years. She denies fever. ER precautions discussed   - US  ABDOMEN COMPLETE; Future    2. OSA (obstructive sleep apnea)  -Sleep study shows mild OSA, will refer to pulm for further evaluation and discussion of CPAP  - Evaline Mayo, MD -  Pulmonology    3. Prediabetes  -Last A1c 5.9, will hold off on starting Metformin for now, cont dietary and lifestyle modifications       Follow  up and Dispositions:  No follow-ups on file.Keep already scheduled appt with Dr. JENEANE on 05/05/2024, sooner if needed        Jannah Guardiola C Axavier Pressley , APRN - NP

## 2024-02-13 ENCOUNTER — Inpatient Hospital Stay: Admit: 2024-02-13 | Payer: BLUE CROSS/BLUE SHIELD | Primary: Family Medicine

## 2024-02-13 DIAGNOSIS — R1011 Right upper quadrant pain: Principal | ICD-10-CM

## 2024-02-13 DIAGNOSIS — K8020 Calculus of gallbladder without cholecystitis without obstruction: Principal | ICD-10-CM

## 2024-02-14 ENCOUNTER — Encounter

## 2024-02-14 ENCOUNTER — Inpatient Hospital Stay: Admit: 2024-02-14 | Payer: BLUE CROSS/BLUE SHIELD | Primary: Family Medicine

## 2024-02-14 DIAGNOSIS — Z1231 Encounter for screening mammogram for malignant neoplasm of breast: Principal | ICD-10-CM

## 2024-02-27 ENCOUNTER — Inpatient Hospital Stay: Admit: 2024-02-27 | Payer: BLUE CROSS/BLUE SHIELD | Attending: Family Medicine | Primary: Family Medicine

## 2024-02-27 DIAGNOSIS — K828 Other specified diseases of gallbladder: Principal | ICD-10-CM

## 2024-02-27 MED ORDER — GADOTERIDOL 279.3 MG/ML IV SOLN
279.3 | Freq: Once | INTRAVENOUS | Status: AC | PRN
Start: 2024-02-27 — End: 2024-02-27
  Administered 2024-02-27: 17:00:00 14 mL via INTRAVENOUS

## 2024-05-05 ENCOUNTER — Ambulatory Visit
Admit: 2024-05-05 | Discharge: 2024-05-05 | Payer: BLUE CROSS/BLUE SHIELD | Attending: Family Medicine | Primary: Family Medicine

## 2024-05-05 VITALS — BP 130/84 | HR 78 | Temp 98.60000°F | Resp 18 | Ht <= 58 in | Wt 151.1 lb

## 2024-05-05 DIAGNOSIS — I10 Essential (primary) hypertension: Principal | ICD-10-CM

## 2024-05-05 NOTE — Progress Notes (Signed)
 CHIEF COMPLAINT:  Chief Complaint   Patient presents with    Follow-up     Gallbladder sx next month concerns         HISTORY OF PRESENT ILLNESS:  [name] is a 48 y.o. female  who presents in followup.   She notes that her mother passed away in [place] just 3 weeks ago. She was able to be there but it was very shocking.  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  She is thankful that her mother did not suffer long. She is crying today but feels she will continue to grieve but it will pass. Her husband took off to stay with her. She has a good support system.   HTN- controlled on current medication  Chronic gallbladder issues- She is going to see about who in [facility] can remove her GB based on her doctor friends here at [facility]- she will call me and I will place referral.   Suspected OSA- Still has some elevated BP and HR at night at times- still needs to get sleep study. She could not get in with Pulmonary due to being in [place] with her mother dying. She will work on this.   Prediabetes- needs rechecked  Vit D Deficiency- noted needs rechecked.     PHQ:      05/05/2024    10:13 AM   PHQ-9    Little interest or pleasure in doing things 3   Feeling down, depressed, or hopeless 1   Trouble falling or staying asleep, or sleeping too much 1   Feeling tired or having little energy 1   Poor appetite or overeating 0   Feeling bad about yourself - or that you are a failure or have let yourself or your family down 0   Trouble concentrating on things, such as reading the newspaper or watching television 1   Moving or speaking so slowly that other people could have noticed. Or the opposite - being so fidgety or restless that you have been moving around a lot more than usual 0   Thoughts that you would be better off dead, or of hurting yourself in some way 0   PHQ-2 Score 4   PHQ-9 Total Score 7       CURRENT MEDICATION  LIST:    Current Outpatient Medications   Medication Sig Dispense Refill    omeprazole  (PRILOSEC ) 40 MG delayed release capsule Take 40 mg by mouth as needed ONLY TABLET FORM      candesartan  (ATACAND ) 8 MG tablet Take 1 tablet by mouth daily 90 tablet 3    Cholecalciferol 50 MCG (2000 UT) TABS Take 1 tablet by mouth daily      pantoprazole (PROTONIX) 40 MG tablet Take 1 tablet by mouth daily (Patient not taking: Reported on 05/05/2024)      sucralfate (CARAFATE) 1 GM tablet Take 1 tablet by mouth 2 times daily (Patient not taking: Reported on 05/05/2024)      omeprazole  (PRILOSEC  OTC) 20 MG tablet Take 2 tablets by mouth daily (Patient not taking: Reported on 05/05/2024) 180 tablet 1     No current facility-administered medications for this visit.        ALLERGIES:    Allergies   Allergen Reactions    Kiwi Extract Other (See Comments)     Hayfever    Other  All fruits cause hayfever        HISTORY:  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    History of high cholesterol     Hyperlipidemia August , 2024    Hypertension August ,2024      Past Surgical History:   Procedure Laterality Date    BREAST BIOPSY Right     MYOMECTOMY  10/14/2018    US  BREAST BIOPSY W LOC DEVICE 1ST LESION RIGHT        Social History     Socioeconomic History    Marital status: Married     Spouse name: Not on file    Number of children: Not on file    Years of education: Not on file    Highest education level: Not on file   Occupational History    Not on file   Tobacco Use    Smoking status: Never    Smokeless tobacco: Never   Vaping Use    Vaping status: Never Used   Substance and Sexual Activity    Alcohol use: Never    Drug use: Never    Sexual activity: Yes     Partners: Male     Comment: husband   Other Topics Concern    Not on file   Social History Narrative    Not on file     Social Drivers of Health     Financial Resource Strain: Low Risk  (04/27/2023)    [facility] from [facility]    Overall Physicist, medical Strain (CARDIA)     Difficulty of Paying Living Expenses: Not hard at all   Food Insecurity: Not on file   Transportation Needs: No Transportation Needs (04/27/2023)    Received from [facility]     [employer] - Transportation     Lack of Transportation (Medical): No     Lack of Transportation (Non-Medical): No   Physical Activity: Not on file   Stress: Not on file   Social Connections: Not on file   Intimate Partner Violence: Not on file   Housing Stability: Not on file      Family History   Problem Relation Age of Onset    Diabetes Mother     Hypertension Mother     Breast Cancer Mother 74    Other Cancer Mother 3        Bladder cancer    High Blood Pressure Mother     High Cholesterol Mother     Diabetes Father     High Blood Pressure Father     High Cholesterol Father     Left Breast Cancer Sister 63    Diabetes Brother     Hypertension Brother     High Cholesterol Brother     Pancreatic Cancer Maternal Aunt     Endometrial Cancer Paternal Aunt 21    Other Cancer Paternal Aunt         Possible bone or GI cancer    Brain Cancer Paternal Uncle         Review of Systems   Constitutional: Negative.    HENT: Negative.     Eyes: Negative.    Respiratory: Negative.     Cardiovascular: Negative.    Gastrointestinal: Negative.    Endocrine: Negative.    Genitourinary: Negative.    Musculoskeletal: Negative.    Skin: Negative.    Allergic/Immunologic: Negative.    Neurological: Negative.  Hematological: Negative.    Psychiatric/Behavioral: Negative.          PHYSICAL EXAM:  Vital Signs -   Visit Vitals  BP 130/84   Pulse 78   Temp 98.6 F (37 C) (Oral)   Resp 18   Ht 1.422 m (4' 8)   Wt 68.5 kg (151 lb 1.6 oz)   SpO2 96%   BMI 33.88 kg/m        Physical Exam  Vitals and nursing note reviewed.   Constitutional:       General: She is not in acute distress.     Appearance: Normal appearance. She is normal weight.   HENT:      Head: Normocephalic and atraumatic.       Mouth/Throat:      Mouth: Mucous membranes are moist.      Pharynx: Oropharynx is clear. No oropharyngeal exudate or posterior oropharyngeal erythema.   Eyes:      Extraocular Movements: Extraocular movements intact.      Conjunctiva/sclera: Conjunctivae normal.      Pupils: Pupils are equal, round, and reactive to light.   Neck:      Vascular: No carotid bruit.   Cardiovascular:      Rate and Rhythm: Normal rate and regular rhythm.      Heart sounds: Normal heart sounds. No murmur heard.  Pulmonary:      Effort: Pulmonary effort is normal. No respiratory distress.      Breath sounds: Normal breath sounds.   Abdominal:      General: Bowel sounds are normal. There is no distension.      Palpations: Abdomen is soft.      Tenderness: There is no abdominal tenderness.   Musculoskeletal:         General: No swelling or tenderness. Normal range of motion.      Cervical back: Normal range of motion and neck supple.      Right lower leg: No edema.      Left lower leg: No edema.   Lymphadenopathy:      Cervical: No cervical adenopathy.   Skin:     General: Skin is warm and dry.      Findings: No erythema or rash.   Neurological:      General: No focal deficit present.      Mental Status: She is alert and oriented to person, place, and time. Mental status is at baseline.      Comments: Tearful when speaking of her mother   Psychiatric:         Mood and Affect: Mood normal.         Behavior: Behavior normal.         Thought Content: Thought content normal.         Judgment: Judgment normal.          LABS  No results found for this visit on 05/05/24.  Orders Only on 07/30/2023   Component Date Value Ref Range Status    Color, UA 07/30/2023 Yellow   Final    Appearance 07/30/2023 Clear   Final    Glucose, Ur 07/30/2023 Negative  Negative Final    Bilirubin, Urine 07/30/2023 Negative  Negative Final    Ketones, Urine 07/30/2023 Negative  Negative Final    Specific Gravity, UA 07/30/2023 1.020  1.003 - 1.035 Final    Blood,  Urine 07/30/2023 Negative  Negative Final    pH, Urine 07/30/2023 6.0  4.5 - 8.0 Final  Protein, UA 07/30/2023 Negative  Negative Final    Urobilinogen, Urine 07/30/2023 0.2  >1.0 EU/dL Final    Nitrite, Urine 07/30/2023 Negative  Negative Final    Leukocyte Esterase, Urine 07/30/2023 Negative  Negative Final    TSH 07/30/2023 2.590  0.358 - 3.740 mcIU/mL Final    Comment: TSH INTERPRETATION:    Controversy exists over an acceptable TSH range. Some experts argue that a  narrower reference range is better and will increase the detection of thyroid  disease, particularly marginal hypothyroidism.      Iron 07/30/2023 89  37 - 145 mcg/dL Final    UIBC 87/83/7975 209.0  112.0 - 347.0 mcg/dL Final    TIBC 87/83/7975 298  250 - 450 mcg/dL Final    Iron % Saturation 07/30/2023 30  20 - 40 % Final    Ferritin 07/30/2023 40.4  13.0 - 150.0 ng/mL Final    Cholesterol, Total 07/30/2023 227 (H)  100 - 200 mg/dL Final    Comment: The National Cholesterol Education Program has published reference  cholesterol values for cardiovascular risk to be:    Less than 200 mg/dL     = Low Risk    799 to 239 mg/dL        = Borderline Risk    240mg /dL and greater    = High Risk      HDL 07/30/2023 53  >=50 mg/dL Final    Comment: The National Lipid Association and the [employer] Cholesterol Education Program  (NCEP) have set the guidelines for high-density lipoprotein (HDL) cholesterol  in adults ages 57 and up.      Triglycerides 07/30/2023 141  0 - 149 mg/dL Final    Comment:   TRIGLYCERIDE INTERPRETATION:                          Recommended Fasting Triglyc Levels for Adults                        =============================================                        Desirable                        < 150 mg/dL                        Average                          < 200 mg/dL                        Borderline High             200 to 500 mg/dL                        Hypertriglyceridemic             > 500 mg/dL                         =============================================      LDL Cholesterol 07/30/2023 145.8 (H)  0.0 - 100.0 mg/dL Final    LDL/HDL Ratio 07/30/2023 2.8   Final    Chol/HDL Ratio 07/30/2023 4.3  0.0 - 4.4 Final  VLDL 07/30/2023 28.2  5.0 - 40.0 mg/dL Final    Sodium 87/83/7975 141  135 - 145 mmol/L Final    Potassium 07/30/2023 4.1  3.5 - 5.3 mmol/L Final    Chloride 07/30/2023 104  98 - 107 mmol/L Final    CO2 07/30/2023 22  22 - 29 mmol/L Final    Glucose 07/30/2023 93  70 - 99 mg/dL Final    BUN 87/83/7975 8  6 - 20 mg/dL Final    Creatinine 87/83/7975 0.7  0.5 - 1.0 mg/dL Final    Anion Gap 87/83/7975 15  2 - 17 mmol/L Final    Osmolaliy Calculated 07/30/2023 279  270 - 287 mOsm/kg Final    Calcium 07/30/2023 9.3  8.5 - 10.7 mg/dL Final    Total Protein 07/30/2023 7.0  5.7 - 8.3 g/dL Final    Albumin 87/83/7975 4.8  3.5 - 5.2 g/dL Final    Globulin 87/83/7975 2.2  1.9 - 4.4 g/dL Final    Albumin/Globulin Ratio 07/30/2023 2.20  1.00 - 2.70 Final    Total Bilirubin 07/30/2023 0.34  0.00 - 1.20 mg/dL Final    Alk Phosphatase 07/30/2023 60  35 - 117 unit/L Final    AST 07/30/2023 19  0 - 46 unit/L Final    ALT 07/30/2023 15  0 - 42 unit/L Final    Est, Glom Filt Rate 07/30/2023 107  >=60 mL/min/1.20m Final    Comment: VERIFIED by Discern Expert.  GFR Interpretation:                                                                         % OF  KIDNEY  GFR                                                        STAGE  FUNCTION  ==================================================================================    > 90        Normal kidney function                       STAGE 1  90-100%  89 to 60      Mild loss of kidney function                 STAGE 2  80-60%  59 to 45      Mild to moderate loss of kidney function     STAGE 3a  59-45%  44 to 30      Moderate to severe loss of kidney function   STAGE 3b  44-30%  29 to 15      Severe loss of kidney function               STAGE 4  29-15%    < 15        Kidney failure                                STAGE 5  <15%  ==================================================================================  Modified from [employer]    GFR Calculation performed using the CKD-EPI 2021 equation developed for use  with IDMS traceable creatinine methods and                            is the calculation recommended by  the [facility] for estimating GFR in adults.      Vit D, 25-Hydroxy 07/30/2023 28.2 (L)  30.0 - 90.0 ng/mL Final    Hemoglobin A1C 07/30/2023 5.9  4.0 - 6.0 % Final    Comment: HEMOGLOBIN A1C INTERPRETATION:    The following arbitrary ranges may be used for interpretation of the results.  However, factors such as duration of diabetes, adherence to therapy, and  patient age should also be considered in assessing degree of blood glucose  control.    Hemoglobin A1C                 Avg. Blood Sugar  --------------------------------------------------------------  6%                           135 mg/dL  7%                           170 mg/dL  8%                           205 mg/dL  9%                           240 mg/dL  89%                          275 mg/dL    ======================================================    A1C                      Glucose Control  ----------------------------------------------------------------  < 6.0 %                   Normal  6.0 - 6.9 %               Abnormal  7.0 - 7.9 %               Sub-Optimal Control  > 8.0 %                   Inadequate Control      Estimated Avg Glucose 07/30/2023 123   Final    Estimated Avg Glucose 07/30/2023 133   Final    WBC 07/30/2023 5.2  3.8 - 10.6 x10e3/mcL Final    RBC 07/30/2023 4.75  3.60 - 5.20 x10e6/mcL Final    Hemoglobin 07/30/2023 13.9  11.5 - 15.7 g/dL Final    Hematocrit 87/83/7975 42.0  34.0 - 47.0 % Final    MCV 07/30/2023 88.4  81.0 - 99.0 fL Final    MCH 07/30/2023 29.3  27.0 - 34.5 pg Final    MCHC 07/30/2023 33.1  30.0 - 36.0 g/dL Final    RDW 87/83/7975 11.9  10.0 - 17.0 % Final     Platelets 07/30/2023 260  140 - 440 x10e3/mcL Final    MPV 07/30/2023 9.9  7.0 - 12.2 fL Final    NRBC Automated 07/30/2023 0.0  0.0 - 0.2 % Final  NRBC Absolute 07/30/2023 0.000  0.000 - 0.012 x10e3/mcL Final    Neutrophils % 07/30/2023 66.4  42.0 - 74.0 % Final    Lymphocytes 07/30/2023 22.4  15.0 - 45.0 % Final    Monocytes % 07/30/2023 8.7  4.0 - 12.0 % Final    Eosinophils % 07/30/2023 1.7  0.0 - 7.0 % Final    Basophils % 07/30/2023 0.6  0.0 - 2.0 % Final    Neutrophils Absolute 07/30/2023 3.4  1.6 - 7.3 x10e3/mcL Final    Lymphocytes Absolute 07/30/2023 1.2  1.0 - 3.2 x10e3/mcL Final    Monocytes Absolute 07/30/2023 0.5  0.3 - 1.0 x10e3/mcL Final    Eosinophils Absolute 07/30/2023 0.1  0.0 - 0.5 x10e3/mcL Final    Basophils Absolute 07/30/2023 0.0  0.0 - 0.2 x10e3/mcL Final    Immature Granulocytes % 07/30/2023 0.2  0.0 - 0.6 % Final    Immature Grans (Abs) 07/30/2023 0.01  0.00 - 0.06 x10e3/mcL Final       IMPRESSION/PLAN    1. Primary hypertension  Controlled on current medication, cont to monitor  - CBC with Auto Differential; Future  - Comprehensive Metabolic Panel; Future    2. Grief reaction  Still very fresh for pt as just occurred this past month.  Pt appears to be in a normal grieving process at this time with good social support. However, pt will cont to monitor herself and will reach out if feels she is not progressing.     3. Chronic cholecystitis  Noted on MRI prior and for years had intermittent issues per pt and was advised may need to have this out. However, mother got sick in the middle of this process and pt just returned from [place].  Pt does want to pursue this now and will get back with me the name of GI surgeon she would like to see at [facility] to pursue surgical intervention.     4. Right upper quadrant abdominal pain  Stable today but intermittent as above likely due to chronic cholecystitis, needs to see GI surgeon at [facility] but will let me know who later this week    5.  Prediabetes  Recheck, notes diet may not have been the best in the last few months with stressor of caretaking, traveling and now the shocking death of her mother.   - Hemoglobin A1C; Future  - Vitamin B12; Future    6. Mixed hyperlipidemia  Stable on statin, cont to monitor, screening labs as ordered  - Lipid Panel; Future  - TSH reflex to FT4; Future    7. Vitamin D deficiency  Cont on supplementation  - Vitamin D 25 Hydroxy; Future    8. Iron deficiency  Recheck pending.   - Ferritin; Future  - Iron and TIBC; Future     Follow up and Dispositions:  Return for 3-4 months.       Ramon Zanders Elizabeth Cadey Bazile, MD

## 2024-05-12 ENCOUNTER — Encounter

## 2024-05-12 LAB — CBC WITH AUTO DIFFERENTIAL
Basophils %: 0.9 % (ref 0.0–2.0)
Basophils Absolute: 0 x10e3/mcL (ref 0.0–0.2)
Eosinophils %: 2.8 % (ref 0.0–7.0)
Eosinophils Absolute: 0.1 x10e3/mcL (ref 0.0–0.5)
Hematocrit: 42.4 % (ref 34.0–47.0)
Hemoglobin: 14 g/dL (ref 11.5–15.7)
Immature Grans (Abs): 0.02 x10e3/mcL (ref 0.00–0.06)
Immature Granulocytes %: 0.5 % (ref 0.0–0.6)
Lymphocytes Absolute: 1 x10e3/mcL (ref 1.0–3.2)
Lymphocytes: 23.4 % (ref 15.0–45.0)
MCH: 29.2 pg (ref 27.0–34.5)
MCHC: 33 g/dL (ref 30.0–36.0)
MCV: 88.3 fL (ref 81.0–99.0)
MPV: 9.7 fL (ref 7.0–12.2)
Monocytes %: 9.3 % (ref 4.0–12.0)
Monocytes Absolute: 0.4 x10e3/mcL (ref 0.3–1.0)
NRBC Absolute: 0 x10e3/mcL (ref 0.000–0.012)
NRBC Automated: 0 % (ref 0.0–0.2)
Neutrophils %: 63.1 % (ref 42.0–74.0)
Neutrophils Absolute: 2.7 x10e3/mcL (ref 1.6–7.3)
Platelets: 234 x10e3/mcL (ref 140–440)
RBC: 4.8 x10e6/mcL (ref 3.60–5.20)
RDW: 11.8 % (ref 10.0–17.0)
WBC: 4.3 x10e3/mcL (ref 3.8–10.6)

## 2024-05-12 LAB — HEMOGLOBIN A1C
Estimated Avg Glucose: 126
Estimated Avg Glucose: 136
Hemoglobin A1C: 6 % (ref 4.0–6.0)

## 2024-05-12 LAB — COMPREHENSIVE METABOLIC PANEL
ALT: 24 U/L (ref 0–42)
AST: 21 U/L (ref 0–46)
Albumin/Globulin Ratio: 2.1 (ref 1.00–2.70)
Albumin: 4.6 g/dL (ref 3.5–5.2)
Alk Phosphatase: 62 U/L (ref 35–117)
Anion Gap: 13 mmol/L (ref 2–17)
BUN: 12 mg/dL (ref 6–20)
CO2: 22 mmol/L (ref 22–29)
Calcium: 9.4 mg/dL (ref 8.5–10.7)
Chloride: 103 mmol/L (ref 98–107)
Creatinine: 0.7 mg/dL (ref 0.5–1.0)
Est, Glom Filt Rate: 107 mL/min/1.73m (ref >=60)
Globulin: 2.2 g/dL (ref 1.9–4.4)
Glucose: 96 mg/dL (ref 70–99)
Osmolaliy Calculated: 275 mosm/kg (ref 270–287)
Potassium: 4.1 mmol/L (ref 3.5–5.3)
Sodium: 138 mmol/L (ref 135–145)
Total Bilirubin: 0.34 mg/dL (ref 0.00–1.20)
Total Protein: 6.8 g/dL (ref 5.7–8.3)

## 2024-05-12 LAB — LIPID PANEL
Chol/HDL Ratio: 4.6 — ABNORMAL HIGH (ref 0.0–4.4)
Cholesterol, Total: 229 mg/dL — ABNORMAL HIGH (ref 100–200)
HDL: 50 mg/dL (ref >=50)
LDL Cholesterol: 142.8 mg/dL — ABNORMAL HIGH (ref 0.0–100.0)
LDL/HDL Ratio: 2.9
Triglycerides: 181 mg/dL — ABNORMAL HIGH (ref 0–149)
VLDL: 36.2 mg/dL (ref 5.0–40.0)

## 2024-05-12 LAB — IRON AND TIBC
Iron % Saturation: 46 % — ABNORMAL HIGH (ref 20–40)
Iron: 138 ug/dL (ref 37–145)
TIBC: 300 ug/dL (ref 250–450)
UIBC: 162 ug/dL (ref 112.0–347.0)

## 2024-05-12 LAB — FERRITIN: Ferritin: 56.9 ng/mL (ref 13.0–150.0)

## 2024-05-13 LAB — VITAMIN D 25 HYDROXY: Vit D, 25-Hydroxy: 25.4 ng/mL — ABNORMAL LOW (ref 30.0–90.0)

## 2024-05-13 LAB — VITAMIN B12: Vitamin B-12: 402 pg/mL (ref 232–1245)

## 2024-05-13 LAB — TSH REFLEX TO FT4: TSH: 2.48 u[IU]/mL (ref 0.358–3.740)

## 2024-05-26 ENCOUNTER — Encounter

## 2024-05-28 NOTE — Telephone Encounter (Signed)
"  Patient following up on referral to the surgeon that she had provided the name to Dr. Zacarias to see if the referral has been sent over yet so that she can schedule. Please call to advise or respond Mychart  "

## 2024-05-29 ENCOUNTER — Encounter

## 2024-05-29 NOTE — Telephone Encounter (Signed)
 Response via mychart to patient

## 2024-05-30 NOTE — Addendum Note (Signed)
"  Addended by: Brenae Lasecki , Abednego Yeates C on: 05/30/2024 01:02 PM     Modules accepted: Orders    "

## 2024-08-26 ENCOUNTER — Ambulatory Visit
Admit: 2024-08-26 | Discharge: 2024-08-26 | Payer: BLUE CROSS/BLUE SHIELD | Attending: Family Medicine | Primary: Family Medicine

## 2024-08-26 VITALS — BP 124/86 | HR 90 | Temp 98.30000°F | Resp 18 | Ht <= 58 in | Wt 148.7 lb

## 2024-08-26 DIAGNOSIS — I10 Essential (primary) hypertension: Principal | ICD-10-CM

## 2024-08-26 MED ORDER — CANDESARTAN CILEXETIL 8 MG PO TABS
8 | ORAL_TABLET | Freq: Every day | ORAL | 3 refills | Status: AC
Start: 2024-08-26 — End: ?

## 2024-08-26 MED ORDER — METFORMIN HCL ER 500 MG PO TB24
500 | ORAL_TABLET | Freq: Every day | ORAL | 1 refills | Status: AC
Start: 2024-08-26 — End: ?

## 2024-08-26 NOTE — Progress Notes (Signed)
 CHIEF COMPLAINT:  Chief Complaint   Patient presents with    Follow-up     Gallbaldder surgery on the 5th of Dec.        HISTORY OF PRESENT ILLNESS:  Sabrina Valenzuela is a 49 y.o. female  who presents in follow-up.   Now s/p cholecystectomy on Jul 18, 2024. She notes that diarrhea off/on since she had this removed. She notes that she cooks at home but trying to avoid fatty or fried foods. She cooks mostly at home.   HTN- controlled on current medication  HPL- stopped statin  Vitamin D Deficiency- taking supplementation  Vitamin B12 Deficiency- not taking supplementation at this time but considering something like biotin for hair loss  Prediabetes- worsening on last labs, would like to try metformin  Xr as did tolerate this well in the past with some weight loss as well.     PHQ:      08/25/2024     6:43 AM   PHQ-9    Little interest or pleasure in doing things 0   Feeling down, depressed, or hopeless 0   PHQ-2 Score 0    PHQ-9 Total Score 0        Patient-reported       CURRENT MEDICATION LIST:    Current Outpatient Medications   Medication Sig Dispense Refill    metFORMIN  (GLUCOPHAGE -XR) 500 MG extended release tablet Take 1 tablet by mouth daily (with breakfast) 30 tablet 1    candesartan  (ATACAND ) 8 MG tablet Take 1 tablet by mouth daily 90 tablet 3    Cholecalciferol 50 MCG (2000 UT) TABS Take 1 tablet by mouth daily       No current facility-administered medications for this visit.        ALLERGIES:    Allergies   Allergen Reactions    Kiwi Extract Other (See Comments)     Hayfever    Other      All fruits cause hayfever        HISTORY:  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    History of high cholesterol     Hyperlipidemia August , 2024    Hypertension August ,2024      Past Surgical History:   Procedure Laterality Date    BREAST BIOPSY Right     MYOMECTOMY  10/14/2018    US  BREAST BIOPSY W LOC DEVICE 1ST LESION RIGHT        Social History     Socioeconomic History    Marital  status: Married     Spouse name: Not on file    Number of children: Not on file    Years of education: Not on file    Highest education level: Not on file   Occupational History    Not on file   Tobacco Use    Smoking status: Never    Smokeless tobacco: Never   Vaping Use    Vaping status: Never Used   Substance and Sexual Activity    Alcohol use: Never    Drug use: Never    Sexual activity: Yes     Partners: Male     Comment: husband   Other Topics Concern    Not on file   Social History Narrative    Not on file     Social Drivers of Health     Financial Resource Strain: Low Risk (04/27/2023)    Received from [facility]   Overall Financial Resource Strain (CARDIA)     Difficulty of Paying Living Expenses: Not hard at all   Food Insecurity: Not on file   Transportation Needs: No Transportation Needs (04/27/2023)    Received from [facility]     [employer] - Transportation     Lack of Transportation (Medical): No     Lack of Transportation (Non-Medical): No   Physical Activity: Not on file   Stress: Not on file   Social Connections: Not on file   Intimate Partner Violence: Not At Risk (07/18/2024)    Received from [facility]     Abuse Screen     Feels Unsafe at Home or Work/School: no     Feels Threatened by Someone: no     Does Anyone Try to Keep You From Having Contact with Others or Doing Things Outside Your Home?: no     Physical Signs of Abuse Present: no   Housing Stability: Not on file      Family History   Problem Relation Age of Onset    Diabetes Mother     Hypertension Mother     Breast Cancer Mother 58    Other Cancer Mother 3        Bladder cancer    High Blood Pressure Mother     High Cholesterol Mother     Diabetes Father     High Blood Pressure Father     High Cholesterol Father     Left Breast Cancer Sister 53    Diabetes Brother     Hypertension Brother     High Cholesterol Brother     Pancreatic Cancer Maternal Aunt      Endometrial Cancer Paternal Aunt 21    Other Cancer Paternal Aunt         Possible bone or GI cancer    Brain Cancer Paternal Uncle         Review of Systems   Constitutional: Negative.    HENT: Negative.     Eyes: Negative.    Respiratory: Negative.     Cardiovascular: Negative.    Gastrointestinal: Negative.    Endocrine: Negative.    Genitourinary: Negative.    Musculoskeletal: Negative.    Skin: Negative.    Allergic/Immunologic: Negative.    Neurological: Negative.    Hematological: Negative.    Psychiatric/Behavioral: Negative.          PHYSICAL EXAM:  Vital Signs -   Visit Vitals  BP 124/86 (BP Site: Left Upper Arm, Patient Position: Sitting, BP Cuff Size: Medium Adult)   Pulse 90   Temp 98.3 F (36.8 C) (Oral)   Resp 18   Ht 1.422 m (4' 7.98)   Wt 67.4 kg (148 lb 11.2 oz)   SpO2 98%   BMI 33.36 kg/m        Physical Exam  Vitals and nursing note reviewed.   Constitutional:       General: She is not in acute distress.     Appearance: Normal appearance. She is normal weight.   HENT:      Head: Normocephalic and atraumatic.      Mouth/Throat:      Mouth: Mucous membranes are moist.      Pharynx: Oropharynx is clear. No oropharyngeal exudate or posterior oropharyngeal erythema.   Eyes:      Extraocular Movements: Extraocular movements intact.      Conjunctiva/sclera: Conjunctivae normal.      Pupils: Pupils are equal,  round, and reactive to light.   Neck:      Vascular: No carotid bruit.   Cardiovascular:      Rate and Rhythm: Normal rate and regular rhythm.      Heart sounds: Normal heart sounds. No murmur heard.  Pulmonary:      Effort: Pulmonary effort is normal. No respiratory distress.      Breath sounds: Normal breath sounds.   Abdominal:      General: Bowel sounds are normal. There is no distension.      Palpations: Abdomen is soft.      Tenderness: There is no abdominal tenderness.      Comments: Incision sites healing well   Musculoskeletal:         General: No swelling or tenderness. Normal range of  motion.      Cervical back: Normal range of motion and neck supple.      Right lower leg: No edema.      Left lower leg: No edema.   Lymphadenopathy:      Cervical: No cervical adenopathy.   Skin:     General: Skin is warm and dry.      Findings: No erythema or rash.   Neurological:      General: No focal deficit present.      Mental Status: She is alert and oriented to person, place, and time. Mental status is at baseline.   Psychiatric:         Mood and Affect: Mood normal.         Behavior: Behavior normal.         Thought Content: Thought content normal.         Judgment: Judgment normal.          LABS  No results found for this visit on 08/26/24.  Orders Only on 05/12/2024   Component Date Value Ref Range Status    Iron 05/12/2024 138  37 - 145 mcg/dL Final    UIBC 90/70/7974 162.0  112.0 - 347.0 mcg/dL Final    TIBC 90/70/7974 300  250 - 450 mcg/dL Final    Iron % Saturation 05/12/2024 46 (H)  20 - 40 % Final    Ferritin 05/12/2024 56.9  13.0 - 150.0 ng/mL Final    Vitamin B-12 05/12/2024 402  232 - 1245 pg/mL Final    Comment: Effective 07/19/16 Vitamin B12 Methodology Change - Vitamin B12 Reference Range  has been revised.      TSH 05/12/2024 2.480  0.358 - 3.740 mcIU/mL Final    Comment: TSH INTERPRETATION:    Controversy exists over an acceptable TSH range. Some experts argue that a  narrower reference range is better and will increase the detection of thyroid  disease, particularly marginal hypothyroidism.      Vit D, 25-Hydroxy 05/12/2024 25.4 (L)  30.0 - 90.0 ng/mL Final    Cholesterol, Total 05/12/2024 229 (H)  100 - 200 mg/dL Final    Comment: The National Cholesterol Education Program has published reference  cholesterol values for cardiovascular risk to be:    Less than 200 mg/dL     = Low Risk    799 to 239 mg/dL        = Borderline Risk    240mg /dL and greater    = High Risk      HDL 05/12/2024 50  >=50 mg/dL Final    Comment: The National Lipid Association and the [employer] Cholesterol Education  Program  (NCEP) have set the  guidelines for high-density lipoprotein (HDL) cholesterol  in adults ages 60 and up.      Triglycerides 05/12/2024 181 (H)  0 - 149 mg/dL Final    Comment:   TRIGLYCERIDE INTERPRETATION:                          Recommended Fasting Triglyc Levels for Adults                        =============================================                        Desirable                        < 150 mg/dL                        Average                          < 200 mg/dL                        Borderline High             200 to 500 mg/dL                        Hypertriglyceridemic             > 500 mg/dL                        =============================================      LDL Cholesterol 05/12/2024 142.8 (H)  0.0 - 100.0 mg/dL Final    Comment: [facility] utilize the [employer] equation for calculating LDL.    LDL = Total Cholesterol - HDL Cholesterol - (Triglyceride/5)      LDL/HDL Ratio 05/12/2024 2.9   Final    Chol/HDL Ratio 05/12/2024 4.6 (H)  0.0 - 4.4 Final    VLDL 05/12/2024 36.2  5.0 - 40.0 mg/dL Final    Hemoglobin J8R 05/12/2024 6.0  4.0 - 6.0 % Final    Comment: HEMOGLOBIN A1C INTERPRETATION:    The following arbitrary ranges may be used for interpretation of the results.  However, factors such as duration of diabetes, adherence to therapy, and  patient age should also be considered in assessing degree of blood glucose  control.    Hemoglobin A1C                 Avg. Blood Sugar  --------------------------------------------------------------  6%                           135 mg/dL  7%                           170 mg/dL  8%                           205 mg/dL  9%                           240 mg/dL  89%  275 mg/dL    ======================================================    A1C                      Glucose Control  ----------------------------------------------------------------  < 6.0 %                   Normal  6.0 - 6.9 %               Abnormal  7.0  - 7.9 %               Sub-Optimal Control  > 8.0 %                   Inadequate Control      Estimated Avg Glucose 05/12/2024 126   Final    Estimated Avg Glucose 05/12/2024 136   Final    Sodium 05/12/2024 138  135 - 145 mmol/L Final    Potassium 05/12/2024 4.1  3.5 - 5.3 mmol/L Final    Chloride 05/12/2024 103  98 - 107 mmol/L Final    CO2 05/12/2024 22  22 - 29 mmol/L Final    Glucose 05/12/2024 96  70 - 99 mg/dL Final    BUN 90/70/7974 12  6 - 20 mg/dL Final    Creatinine 90/70/7974 0.7  0.5 - 1.0 mg/dL Final    Anion Gap 90/70/7974 13  2 - 17 mmol/L Final    Osmolaliy Calculated 05/12/2024 275  270 - 287 mOsm/kg Final    Calcium 05/12/2024 9.4  8.5 - 10.7 mg/dL Final    Total Protein 05/12/2024 6.8  5.7 - 8.3 g/dL Final    Albumin 90/70/7974 4.6  3.5 - 5.2 g/dL Final    Globulin 90/70/7974 2.2  1.9 - 4.4 g/dL Final    Albumin/Globulin Ratio 05/12/2024 2.10  1.00 - 2.70 Final    Total Bilirubin 05/12/2024 0.34  0.00 - 1.20 mg/dL Final    Alk Phosphatase 05/12/2024 62  35 - 117 unit/L Final    AST 05/12/2024 21  0 - 46 unit/L Final    ALT 05/12/2024 24  0 - 42 unit/L Final    Est, Glom Filt Rate 05/12/2024 107  >=60 mL/min/1.72m Final    Comment: VERIFIED by Discern Expert.  GFR Interpretation:                                                                         % OF  KIDNEY  GFR                                                        STAGE  FUNCTION  ==================================================================================    > 90        Normal kidney function                       STAGE 1  90-100%  89 to 60      Mild loss of kidney function  STAGE 2  80-60%  59 to 45      Mild to moderate loss of kidney function     STAGE 3a  59-45%  44 to 30      Moderate to severe loss of kidney function   STAGE 3b  44-30%  29 to 15      Severe loss of kidney function               STAGE 4  29-15%    < 15        Kidney failure                               STAGE  5  <15%  ==================================================================================  Modified from National Kidney Foundation    GFR Calculation performed using the CKD-EPI 2021 equation developed for use  with IDMS traceable creatinine methods and                            is the calculation recommended by  the [facility] for estimating GFR in adults.      WBC 05/12/2024 4.3  3.8 - 10.6 x10e3/mcL Final    RBC 05/12/2024 4.80  3.60 - 5.20 x10e6/mcL Final    Hemoglobin 05/12/2024 14.0  11.5 - 15.7 g/dL Final    Hematocrit 90/70/7974 42.4  34.0 - 47.0 % Final    MCV 05/12/2024 88.3  81.0 - 99.0 fL Final    MCH 05/12/2024 29.2  27.0 - 34.5 pg Final    MCHC 05/12/2024 33.0  30.0 - 36.0 g/dL Final    RDW 90/70/7974 11.8  10.0 - 17.0 % Final    Platelets 05/12/2024 234  140 - 440 x10e3/mcL Final    MPV 05/12/2024 9.7  7.0 - 12.2 fL Final    NRBC Automated 05/12/2024 0.0  0.0 - 0.2 % Final    NRBC Absolute 05/12/2024 0.000  0.000 - 0.012 x10e3/mcL Final    Neutrophils % 05/12/2024 63.1  42.0 - 74.0 % Final    Lymphocytes 05/12/2024 23.4  15.0 - 45.0 % Final    Monocytes % 05/12/2024 9.3  4.0 - 12.0 % Final    Eosinophils % 05/12/2024 2.8  0.0 - 7.0 % Final    Basophils % 05/12/2024 0.9  0.0 - 2.0 % Final    Neutrophils Absolute 05/12/2024 2.7  1.6 - 7.3 x10e3/mcL Final    Lymphocytes Absolute 05/12/2024 1.0  1.0 - 3.2 x10e3/mcL Final    Monocytes Absolute 05/12/2024 0.4  0.3 - 1.0 x10e3/mcL Final    Eosinophils Absolute 05/12/2024 0.1  0.0 - 0.5 x10e3/mcL Final    Basophils Absolute 05/12/2024 0.0  0.0 - 0.2 x10e3/mcL Final    Immature Granulocytes % 05/12/2024 0.5  0.0 - 0.6 % Final    Immature Grans (Abs) 05/12/2024 0.02  0.00 - 0.06 x10e3/mcL Final       IMPRESSION/PLAN    1. Primary hypertension  Controlled, cont on current medications  - CBC with Auto Differential; Future  - Comprehensive Metabolic Panel; Future  - Magnesium ; Future  - candesartan  (ATACAND ) 8 MG tablet; Take 1 tablet by mouth  daily  Dispense: 90 tablet; Refill: 3    2. Prediabetes  Noted would like to trial metformin  again as did well in the past, will monitor- aware of potential side effects  - Hemoglobin A1C; Future  - metFORMIN  (GLUCOPHAGE -XR) 500 MG  extended release tablet; Take 1 tablet by mouth daily (with breakfast)  Dispense: 30 tablet; Refill: 1    3. Mixed hyperlipidemia  Now off statin, recheck pending  - Lipid Panel; Future  - TSH reflex to FT4; Future    4. Chronic cholecystitis  Now s/p cholectystectomy, did well and off GERD meds except pepcid prn. Pt would like to check about use of bile salts if needed.     5. Vitamin D deficiency  Cont on supplementation, recheck pending  - Vitamin D 25 Hydroxy; Future    6. Iron deficiency  Needs recheck currently  - Ferritin; Future  - Iron and TIBC; Future    7. B12 deficiency  Will recheck   - Vitamin B12; Future    8. Screening for blood or protein in urine  - Urinalysis W/ Rflx Microscopic; Future     Follow up and Dispositions:  Return for 3-4 months with Dr Sabrina Valenzuela.       Sabrina Mcmorris Elizabeth Sahir Tolson, MD

## 2024-08-27 ENCOUNTER — Encounter

## 2024-08-27 LAB — IRON AND TIBC
Iron % Saturation: 54 % — ABNORMAL HIGH (ref 20–40)
Iron: 151 ug/dL — ABNORMAL HIGH (ref 37–145)
TIBC: 279 ug/dL (ref 250–450)
UIBC: 128 ug/dL (ref 112.0–347.0)

## 2024-08-27 LAB — URINALYSIS W/ RFLX MICROSCOPIC
Bilirubin, Urine: NEGATIVE
Blood, Urine: NEGATIVE
Glucose, Ur: NEGATIVE mg/dL
Ketones, Urine: NEGATIVE mg/dL
Leukocyte Esterase, Urine: NEGATIVE
Nitrite, Urine: NEGATIVE
Protein, UA: NEGATIVE
Specific Gravity, UA: 1.015 (ref 1.003–1.035)
Urobilinogen, Urine: 0.2 EU/dL (ref 0.2–1.0)
pH, Urine: 6.5 (ref 4.5–8.0)

## 2024-08-27 LAB — CBC WITH AUTO DIFFERENTIAL
Basophils %: 0.6 % (ref 0.0–2.0)
Basophils Absolute: 0 x10e3/mcL (ref 0.0–0.2)
Eosinophils %: 2.2 % (ref 0.0–7.0)
Eosinophils Absolute: 0.1 x10e3/mcL (ref 0.0–0.5)
Hematocrit: 41.9 % (ref 34.0–47.0)
Hemoglobin: 14.1 g/dL (ref 11.5–15.7)
Immature Grans (Abs): 0.02 x10e3/mcL (ref 0.00–0.06)
Immature Granulocytes %: 0.4 % (ref 0.0–0.6)
Lymphocytes Absolute: 1.1 x10e3/mcL (ref 1.0–3.2)
Lymphocytes: 22.6 % (ref 15.0–45.0)
MCH: 29.5 pg (ref 27.0–34.5)
MCHC: 33.7 g/dL (ref 30.0–36.0)
MCV: 87.7 fL (ref 81.0–99.0)
MPV: 9.7 fL (ref 7.0–12.2)
Monocytes %: 10 % (ref 4.0–12.0)
Monocytes Absolute: 0.5 x10e3/mcL (ref 0.3–1.0)
NRBC Absolute: 0 x10e3/mcL (ref 0.00–0.01)
NRBC Automated: 0 % (ref 0.0–0.2)
Neutrophils %: 64.2 % (ref 42.0–74.0)
Neutrophils Absolute: 3.2 x10e3/mcL (ref 1.6–7.3)
Platelets: 238 x10e3/mcL (ref 140–440)
RBC: 4.78 x10e6/mcL (ref 3.60–5.20)
RDW: 11.8 % (ref 10.0–17.0)
WBC: 5 x10e3/mcL (ref 3.8–10.6)

## 2024-08-27 LAB — MAGNESIUM: Magnesium: 2.1 mg/dL (ref 1.6–2.6)

## 2024-08-27 LAB — COMPREHENSIVE METABOLIC PANEL
ALT: 16 U/L (ref 0–42)
AST: 16 U/L (ref 0–46)
Albumin/Globulin Ratio: 2.3 (ref 1.00–2.70)
Albumin: 4.9 g/dL (ref 3.5–5.2)
Alk Phosphatase: 66 U/L (ref 35–117)
Anion Gap: 10 mmol/L (ref 2–17)
BUN: 10 mg/dL (ref 6–20)
CO2: 28 mmol/L (ref 22–29)
Calcium: 9.5 mg/dL (ref 8.5–10.7)
Chloride: 101 mmol/L (ref 98–107)
Creatinine: 0.7 mg/dL (ref 0.5–1.0)
Est, Glom Filt Rate: 107 mL/min/1.73m?? (ref >=60)
Globulin: 2.1 g/dL (ref 1.9–4.4)
Glucose: 106 mg/dL — ABNORMAL HIGH (ref 70–99)
Osmolaliy Calculated: 277 mosm/kg (ref 270–287)
Potassium: 4.4 mmol/L (ref 3.5–5.3)
Sodium: 139 mmol/L (ref 135–145)
Total Bilirubin: 0.5 mg/dL (ref 0.00–1.20)
Total Protein: 7 g/dL (ref 5.7–8.3)

## 2024-08-27 LAB — FERRITIN: Ferritin: 70 ng/mL (ref 13.0–150.0)

## 2024-08-27 LAB — HEMOGLOBIN A1C
Estimated Avg Glucose: 120
Estimated Avg Glucose: 129
Hemoglobin A1C: 5.8 % (ref 4.0–6.0)

## 2024-08-27 LAB — LIPID PANEL
Chol/HDL Ratio: 4.3 (ref 0.0–4.4)
Cholesterol, Total: 226 mg/dL — ABNORMAL HIGH (ref 100–200)
HDL: 52 mg/dL (ref >=50)
LDL Cholesterol: 133.2 mg/dL — ABNORMAL HIGH (ref 0.0–100.0)
LDL/HDL Ratio: 2.6
Triglycerides: 204 mg/dL — ABNORMAL HIGH (ref 0–149)
VLDL: 40.8 mg/dL — ABNORMAL HIGH (ref 5.0–40.0)

## 2024-08-27 LAB — VITAMIN B12: Vitamin B-12: 429 pg/mL (ref 232–1245)

## 2024-08-27 LAB — TSH REFLEX TO FT4: TSH: 2.46 u[IU]/mL (ref 0.358–3.740)

## 2024-08-27 LAB — VITAMIN D 25 HYDROXY: Vit D, 25-Hydroxy: 29.9 ng/mL — ABNORMAL LOW (ref 30.0–90.0)

## 2024-09-09 ENCOUNTER — Inpatient Hospital Stay: Admit: 2024-09-09 | Payer: BLUE CROSS/BLUE SHIELD | Primary: Family Medicine

## 2024-09-09 DIAGNOSIS — Z9189 Other specified personal risk factors, not elsewhere classified: Principal | ICD-10-CM

## 2024-09-09 MED ORDER — GADOTERIDOL 279.3 MG/ML IV SOLN
279.3 | Freq: Once | INTRAVENOUS | Status: AC | PRN
Start: 2024-09-09 — End: 2024-09-09
  Administered 2024-09-09: 16:00:00 15 mL via INTRAVENOUS

## 2024-09-11 ENCOUNTER — Ambulatory Visit
Admit: 2024-09-11 | Discharge: 2024-09-11 | Payer: BLUE CROSS/BLUE SHIELD | Attending: Physician Assistant | Primary: Family Medicine

## 2024-09-11 NOTE — Progress Notes (Signed)
 BREAST SURGERY FOLLOW UP VISIT    09/11/2024    CHIEF COMPLAINT:  High risk for future breast cancer.    HISTORY OF PRESENT ILLNESS:  Sabrina Valenzuela is a 49 y.o. woman referred by Dr. Ashleigh Zacarais who requested that I evaluate her for her elevated risk for future breast cancer development.   She presents today to discuss her elevated risk for breast cancer and the best mechanism for surveillance and/or prevention.  The patient states that she herself has not noticed any abnormal masses in either breast.  She denies any change in the appearance of her breasts or the skin of her breasts.  She denies bilateral nipple discharge.  She has no other systemic complaints and otherwise feels well today.     Family History:   Mother was diagnosed with breast cancer at 22/49 yo (no genetics)  Paternal uncle with brain cancer  Maternal aunt diagnosed with pancreatic cancer  Paternal aunt with endometrial cancer  Sister diagnosed with breast cancer at 20 yo (negative genetics).      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 No occult malignancy right or left breast.    Genetics:  07/2023, INVITAE 48 gene panel: DICER1 c.602G>A VUS  **2025 Clinvar review, VUS, 3 submitters, no discrepancies     OB/GYN History and Risk Factors for Breast Cancer:    Age at onset of periods: 61, Last Menstrual Period: 07/07/2023, Age at menopause: n/a, Number of pregnancies:  none, Number of live births: none, History of breast biopsy : yes, Age at first birth : n/a, Years on Birth Control: no, Years on hormone replacement: no, Pregnant or breastfeeding now : no, Hx of radiation to chest: no, Ashkenazi Jewish: no.    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    History of high cholesterol     Hyperlipidemia August , 2024     Hypertension August ,2024     Past Surgical History:   Procedure Laterality Date    BREAST BIOPSY Right     CHOLECYSTECTOMY  07/18/2024    MYOMECTOMY  10/14/2018    US  BREAST BIOPSY W LOC DEVICE 1ST LESION RIGHT       Current Outpatient Medications   Medication Sig Dispense Refill    metFORMIN  (GLUCOPHAGE -XR) 500 MG extended release tablet Take 1 tablet by mouth daily (with breakfast) 30 tablet 1    candesartan  (ATACAND ) 8 MG tablet Take 1 tablet by mouth daily 90 tablet 3    Cholecalciferol 50 MCG (2000 UT) TABS Take 1 tablet by mouth daily       No current facility-administered medications for this visit.     Allergies   Allergen Reactions    Kiwi Extract Other (See Comments)     Hayfever    Other      All fruits cause hayfever     Social History     Socioeconomic History    Marital status: Married     Spouse name: Not on file    Number of children: Not on file    Years of education: Not on file    Highest education level: Not on file   Occupational History    Not on file   Tobacco Use    Smoking status: Never    Smokeless tobacco: Never   Vaping Use  Vaping status: Never Used   Substance and Sexual Activity    Alcohol use: Never    Drug use: Never    Sexual activity: Yes     Partners: Male     Comment: husband   Other Topics Concern    Not on file   Social History Narrative    Not on file     Social Drivers of Health     Financial Resource Strain: Low Risk (04/27/2023)    Received from [facility]     Overall Financial Resource Strain (CARDIA)     Difficulty of Paying Living Expenses: Not hard at all   Food Insecurity: Not on file   Transportation Needs: No Transportation Needs (04/27/2023)    Received from [facility]     [employer] - Transportation     Lack of Transportation (Medical): No     Lack of Transportation (Non-Medical): No   Physical Activity: Not on file   Stress: Not on file   Social Connections: Not on file   Intimate Partner Violence: Not At Risk  (07/18/2024)    Received from [facility]     Abuse Screen     Feels Unsafe at Home or Work/School: no     Feels Threatened by Someone: no     Does Anyone Try to Keep You From Having Contact with Others or Doing Things Outside Your Home?: no     Physical Signs of Abuse Present: no   Housing Stability: Not on file     Family History   Problem Relation Age of Onset    Diabetes Mother     Hypertension Mother     Breast Cancer Mother 11    Other Cancer Mother 56        Bladder cancer    High Blood Pressure Mother     High Cholesterol Mother     Diabetes Father     High Blood Pressure Father     High Cholesterol Father     Left Breast Cancer Sister 91    Diabetes Brother     Hypertension Brother     High Cholesterol Brother     Pancreatic Cancer Maternal Aunt     Endometrial Cancer Paternal Aunt 42    Other Cancer Paternal Aunt         Possible bone or GI cancer    Brain Cancer Paternal Uncle        Review of Systems    General/Constitutional:           Fever denies.  Chills denies.  Headache denies.  Fatigue denies.           Breast:           Breast lump denies.  Breast pain denies.  Nipple discharge denies.  Red skin denies.       Skin:           Rash denies.  Skin cancer denies.  Skin lesion(s) denies.               PHYSICAL EXAMINATION:   BP 116/81 (BP Site: Left Upper Arm, Patient Position: Sitting, BP Cuff Size: Large Adult)   Pulse 94   Wt 67.1 kg (148 lb)   LMP 09/04/2024   SpO2 98%   BMI 33.20 kg/m   General Examination:         GENERAL APPEARANCE: well developed, well nourished, Patient is alert and oriented X 3. No acute  distress. Appropriate affect, looks stated age.          HEAD: normocephalic, atraumatic.          NECK: neck supple, full range of motion.          LYMPH NODES:  no axillary, supraclavicular or cervical adenopathy.            BREASTS:  The patient was examined in the supine and upright positions.                    RIGHT Breast: the skin, nipple and areola are  normal. No nipple discharge was able to be expressed today. There are no retractions or dimpling noted. There is generalized nodularity without a dominant mass. The axillary tail is normal.                     LEFT Breast: the skin, nipple and areola are normal. No nipple discharge was able to be expressed today. There are no retractions or dimpling noted. There is generalized nodularity without a dominant mass. The axillary tail is normal.          SKIN: good turgor, no rashes noted, warm and dry.          MUSCULOSKELETAL: full range of motion, no swelling or deformity.          EXTREMITIES: no edema, clubbing or cyanosis.      Radiology:  Mammogram Result (most recent):  MAM TOMO DIGITAL SCREEN BILATERAL 02/14/2024    Narrative  SCREENING MAMMOGRAM: 02/14/24    INDICATION: Z12.31,Encounter for screening mammogram for malignant neoplasm of  breast,ICD-10-CM. HS:Y    COMPARISON: Prior mammograms 02/05/2023 dating back to 11/25/2020    TECHNIQUE: BILATERAL 3D tomosynthesis and 2D digital mammography.    BREAST DENSITY: The breasts are heterogeneously dense, which may obscure small  masses.    FINDINGS:    Right: No suspicious masses, calcifications, or architectural distortion. There  are biopsy clips in the lower inner quadrant and upper outer quadrant.    Left: No suspicious masses, calcifications, or architectural distortion.    Impression  No suspicious mammographic finding.    OVERALL BIRADS: 2:  Benign    RECOMMENDATION(S):  Bilateral Screening Mammogram in 1 year        Results will be sent to the patient via the electronic medical record and/or a  physical letter.  Performing Facility: [facility] [address] [place] [address] Phone: [phone]        Genetics:      RISK ASSESSMENT:  IBIS, 2024-    NCI, 2024-      Assessment:    ICD-10-CM    1. At high risk for breast cancer  Z91.89 MRI BREAST BILATERAL W WO CONTRAST      2. Breast  fibroadenoma in female, right  D24.1 MRI BREAST BILATERAL W WO CONTRAST      3. Family history of breast cancer  Z80.3 MRI BREAST BILATERAL W WO CONTRAST      4. Heterogeneously dense tissue of both breasts on mammography  R92.333 MRI BREAST BILATERAL W WO CONTRAST      5. Other screening mammogram  Z12.31 MAM TOMO DIGITAL SCREEN BILATERAL             IMPRESSION/RECOMMENDATION:    Sabrina Valenzuela is a 49 y.o. woman who presents today for follow up regarding her elevated risk for future breast  cancer development.     Tyrer Cuzik v8 Lifetime Risk of Breast Cancer, 33.9%  Gail Lifetime Risk of Breast Cancer, 43.8%  Gail 5 year Risk of Breast Cancer, 7.1%    At this time, she does meet criteria for chemoprevention using medications for risk reduction. We discussed that these numbers aren't stagnant and they will change as she ages. She may be eligible for risk reduction medications at some point including Tamoxifen, Raloxifene, and aromatase inhibitors.  We discussed possible side effects of tamoxifen including hot flashes, endometrial cancer as well as VTE.     Discussed the updated NCCN guidelines:   Annual screening mammogram with tomosynthesis:   To begin 10 years prior to when the youngest family member was diagnosed with breast cancer, or after risk assessment if determined to be at high risk, not prior to age 77 yrs or begin at age 27 y (whichever comes first)   Annual breast MRI with and without contrast  To begin 10 years prior to when the youngest family member was diagnosed with breast cancer, not prior to age 78 years or after risk assessment if determined to be at high risk, or begin at age 78 y (whichever comes first)  **Whole breast ultrasound may be done if contrast-enhanced imaging or functional imaging is not available/accessible    With her LTR > 20% she does meet qualifications for high risk imaging surveillance to include annual mammogram, annual breast MRI and two CBEs annually. We discussed the  false positives of breast MRI that require further work up with targeted breast US  with possible biopsy.       Summary:  To summarize, I recommend two breast exams per year, annual mammogram and annual MRI. She was instructed to call if she notes changes in her exam.   Bilateral screening mammom 02/2025, reach out with results; exam with PCP around that time  Plan for CBE and MRI in one year  Genetics - VUS DICER1  Chemoprevention - defer today as patient is trying to conceive  Continue breast surveillane    I spent a total of 20 minutes reviewing the records, imaging studies, reports and counseling the patient regarding the recommended treatment plan for her breast care and documenting in patient record.       Vernell Abler, PA-C

## 2024-09-15 NOTE — Progress Notes (Signed)
 SPOKE WITH PATIENT AND SCHEDULED HER MMG ON 02/23/25 AND THE MRI/CBE ON 08/25/25 AT THE [facility] LOCATION
